# Patient Record
Sex: Male | Born: 1935 | Race: Black or African American | Hispanic: No | Smoking: Never smoker
Health system: Southern US, Community
[De-identification: ages and names within clinical notes are randomized; demographics above are authoritative.]

## PROBLEM LIST (undated history)

## (undated) DIAGNOSIS — R1310 Dysphagia, unspecified: Secondary | ICD-10-CM

## (undated) DIAGNOSIS — D649 Anemia, unspecified: Secondary | ICD-10-CM

## (undated) DIAGNOSIS — M1990 Unspecified osteoarthritis, unspecified site: Secondary | ICD-10-CM

## (undated) DIAGNOSIS — I5022 Chronic systolic (congestive) heart failure: Secondary | ICD-10-CM

## (undated) DIAGNOSIS — N289 Disorder of kidney and ureter, unspecified: Secondary | ICD-10-CM

## (undated) DIAGNOSIS — I10 Essential (primary) hypertension: Secondary | ICD-10-CM

## (undated) DIAGNOSIS — E119 Type 2 diabetes mellitus without complications: Secondary | ICD-10-CM

## (undated) DIAGNOSIS — N400 Enlarged prostate without lower urinary tract symptoms: Secondary | ICD-10-CM

## (undated) DIAGNOSIS — E269 Hyperaldosteronism, unspecified: Secondary | ICD-10-CM

---

## 2006-10-14 ENCOUNTER — Inpatient Hospital Stay (HOSPITAL_COMMUNITY): Admission: EM | Admit: 2006-10-14 | Discharge: 2006-10-18 | Payer: Self-pay | Admitting: Emergency Medicine

## 2006-10-14 ENCOUNTER — Encounter (INDEPENDENT_AMBULATORY_CARE_PROVIDER_SITE_OTHER): Payer: Self-pay | Admitting: Surgery

## 2006-10-14 ENCOUNTER — Ambulatory Visit: Payer: Self-pay | Admitting: Family Medicine

## 2006-10-14 LAB — CONVERTED CEMR LAB: PSA: NORMAL ng/mL

## 2006-11-01 ENCOUNTER — Encounter (INDEPENDENT_AMBULATORY_CARE_PROVIDER_SITE_OTHER): Payer: Self-pay | Admitting: *Deleted

## 2006-11-01 ENCOUNTER — Ambulatory Visit: Payer: Self-pay | Admitting: Family Medicine

## 2006-11-01 DIAGNOSIS — D649 Anemia, unspecified: Secondary | ICD-10-CM

## 2006-11-01 DIAGNOSIS — R809 Proteinuria, unspecified: Secondary | ICD-10-CM | POA: Insufficient documentation

## 2006-11-01 LAB — CONVERTED CEMR LAB: Urinalysis: ABNORMAL

## 2006-11-02 LAB — CONVERTED CEMR LAB
BUN: 10 mg/dL (ref 6–23)
CO2: 25 meq/L (ref 19–32)
Chloride: 101 meq/L (ref 96–112)
Direct LDL: 119 mg/dL — ABNORMAL HIGH
Glucose, Bld: 170 mg/dL — ABNORMAL HIGH (ref 70–99)
Hemoglobin: 12.6 g/dL — ABNORMAL LOW (ref 13.0–17.0)
Potassium: 4.5 meq/L (ref 3.5–5.3)
RBC: 4.31 M/uL (ref 4.22–5.81)
Sodium: 137 meq/L (ref 135–145)
WBC: 4.5 10*3/uL (ref 4.0–10.5)

## 2006-11-11 ENCOUNTER — Ambulatory Visit: Payer: Self-pay | Admitting: Family Medicine

## 2006-11-11 ENCOUNTER — Encounter (INDEPENDENT_AMBULATORY_CARE_PROVIDER_SITE_OTHER): Payer: Self-pay | Admitting: *Deleted

## 2006-11-11 LAB — CONVERTED CEMR LAB
BUN: 10 mg/dL (ref 6–23)
CO2: 23 meq/L (ref 19–32)
Chloride: 106 meq/L (ref 96–112)
Creatinine, Ser: 0.72 mg/dL (ref 0.40–1.50)
Glucose, Bld: 138 mg/dL — ABNORMAL HIGH (ref 70–99)
HDL: 55 mg/dL (ref >39)
LDL Cholesterol: 96 mg/dL (ref 0–99)

## 2006-11-18 ENCOUNTER — Telehealth (INDEPENDENT_AMBULATORY_CARE_PROVIDER_SITE_OTHER): Payer: Self-pay | Admitting: *Deleted

## 2006-11-29 ENCOUNTER — Ambulatory Visit: Payer: Self-pay | Admitting: Family Medicine

## 2006-11-29 DIAGNOSIS — E785 Hyperlipidemia, unspecified: Secondary | ICD-10-CM

## 2006-11-29 LAB — CONVERTED CEMR LAB: Hgb A1c MFr Bld: 8.9 %

## 2006-12-20 ENCOUNTER — Encounter (INDEPENDENT_AMBULATORY_CARE_PROVIDER_SITE_OTHER): Payer: Self-pay | Admitting: *Deleted

## 2006-12-24 ENCOUNTER — Encounter (INDEPENDENT_AMBULATORY_CARE_PROVIDER_SITE_OTHER): Payer: Self-pay | Admitting: *Deleted

## 2007-02-07 ENCOUNTER — Encounter (INDEPENDENT_AMBULATORY_CARE_PROVIDER_SITE_OTHER): Payer: Self-pay | Admitting: *Deleted

## 2007-09-10 ENCOUNTER — Encounter (INDEPENDENT_AMBULATORY_CARE_PROVIDER_SITE_OTHER): Payer: Self-pay | Admitting: *Deleted

## 2007-09-10 ENCOUNTER — Ambulatory Visit: Payer: Self-pay | Admitting: Family Medicine

## 2007-09-10 LAB — CONVERTED CEMR LAB
ALT: 8 units/L (ref 0–53)
AST: 9 units/L (ref 0–37)
Albumin: 4.6 g/dL (ref 3.5–5.2)
Alkaline Phosphatase: 99 units/L (ref 39–117)
BUN: 14 mg/dL (ref 6–23)
Hgb A1c MFr Bld: 9.8 %
Potassium: 4.4 meq/L (ref 3.5–5.3)

## 2007-09-11 ENCOUNTER — Encounter (INDEPENDENT_AMBULATORY_CARE_PROVIDER_SITE_OTHER): Payer: Self-pay | Admitting: *Deleted

## 2007-10-14 ENCOUNTER — Ambulatory Visit: Payer: Self-pay | Admitting: Family Medicine

## 2007-12-23 ENCOUNTER — Encounter (INDEPENDENT_AMBULATORY_CARE_PROVIDER_SITE_OTHER): Payer: Self-pay | Admitting: Family Medicine

## 2007-12-23 ENCOUNTER — Ambulatory Visit: Payer: Self-pay | Admitting: Sports Medicine

## 2007-12-23 DIAGNOSIS — F528 Other sexual dysfunction not due to a substance or known physiological condition: Secondary | ICD-10-CM

## 2007-12-23 LAB — CONVERTED CEMR LAB: Direct LDL: 74 mg/dL

## 2007-12-24 ENCOUNTER — Encounter (INDEPENDENT_AMBULATORY_CARE_PROVIDER_SITE_OTHER): Payer: Self-pay | Admitting: Family Medicine

## 2008-02-10 ENCOUNTER — Telehealth: Payer: Self-pay | Admitting: *Deleted

## 2008-03-29 ENCOUNTER — Telehealth: Payer: Self-pay | Admitting: *Deleted

## 2008-04-19 ENCOUNTER — Telehealth: Payer: Self-pay | Admitting: Family Medicine

## 2008-06-18 ENCOUNTER — Ambulatory Visit: Payer: Self-pay | Admitting: Family Medicine

## 2008-06-18 ENCOUNTER — Telehealth: Payer: Self-pay | Admitting: Family Medicine

## 2008-06-22 ENCOUNTER — Telehealth: Payer: Self-pay | Admitting: *Deleted

## 2008-06-24 ENCOUNTER — Telehealth: Payer: Self-pay | Admitting: *Deleted

## 2008-07-29 ENCOUNTER — Encounter: Payer: Self-pay | Admitting: Family Medicine

## 2008-11-18 IMAGING — CT CT PELVIS W/ CM
2 of 5 series · 17 of 46 positions shown, 19 images · IV contrast (APPLIED)
Comparison: none

10/16/07 ? DUPLICATE COPY for exam association in [facility] ? No change from original report.
CLINICAL DATA: Abdominal pain.
 ABDOMEN CT WITH CONTRAST:
TECHNIQUE: Multidetector CT imaging of the abdomen was performed following the standard protocol during bolus administration of intravenous contrast.
 Contrast:  70 cc Omnipaque 300 and oral contrast.
TECHNIQUE: Multidetector CT imaging of the pelvis was performed following the standard protocol during bolus administration of intravenous contrast.

[Series 2: abd/pelv with 5.0 b31f st · axial · 0.62mm/px · z∈[+534,+940]mm · 14 of 91 slices shown, 16 images]
[im 5/91  soft-tissue]
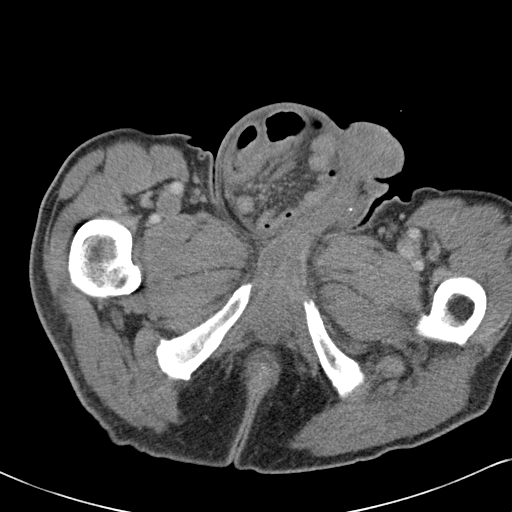
[im 5/91  bone]
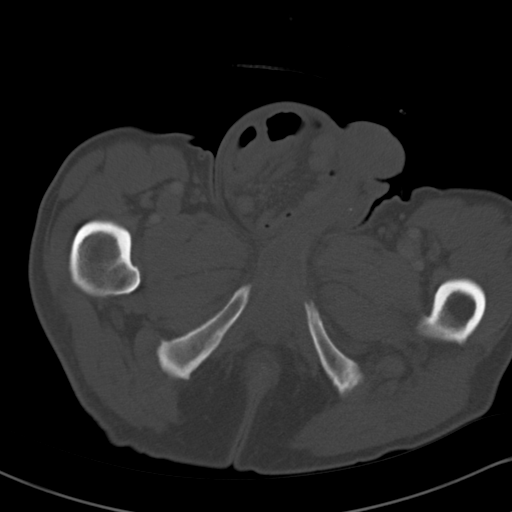
[im 14/91  soft-tissue]
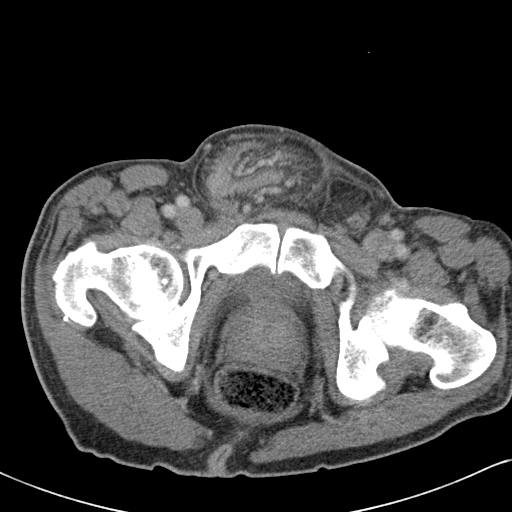
[im 19/91  soft-tissue]
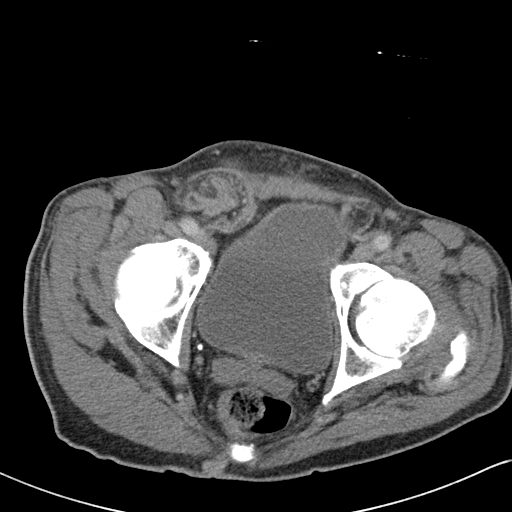
[im 23/91  soft-tissue]
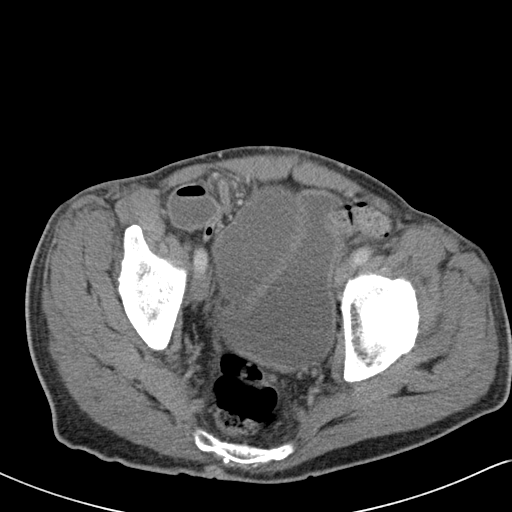
[im 32/91  soft-tissue]
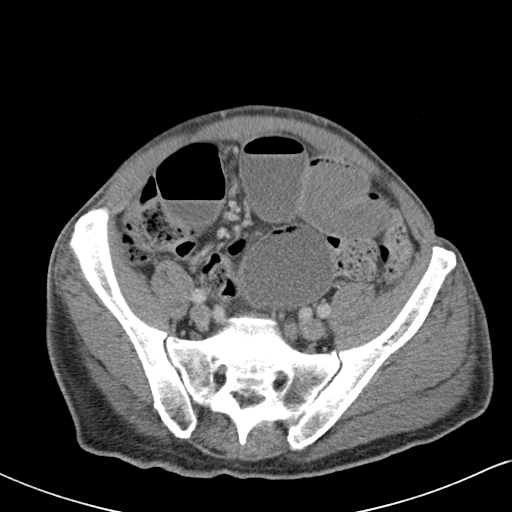
[im 37/91  soft-tissue]
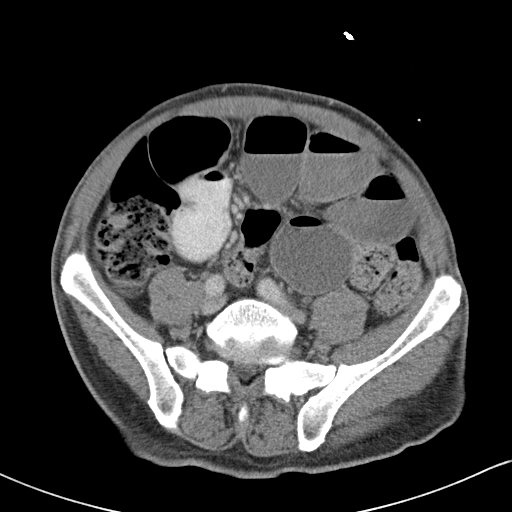
[im 41/91  soft-tissue]
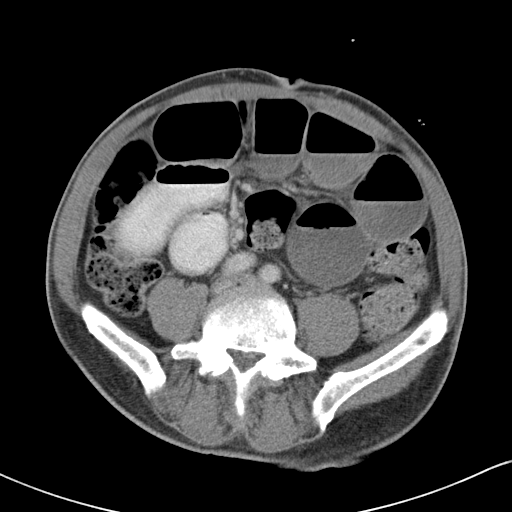
[im 50/91  soft-tissue]
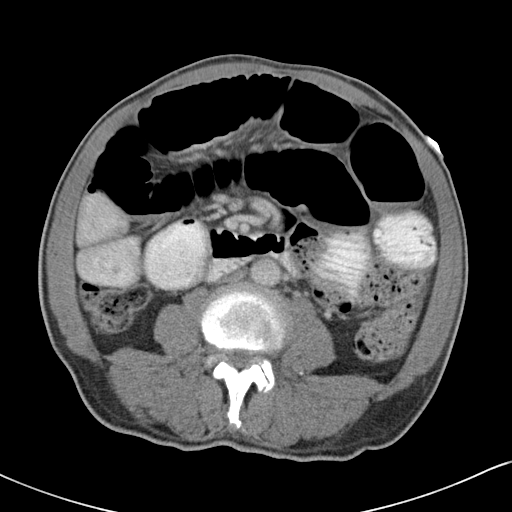
[im 55/91  soft-tissue]
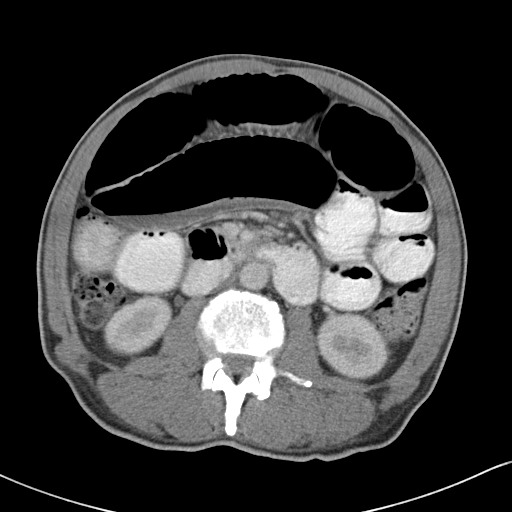
[im 55/91  bone]
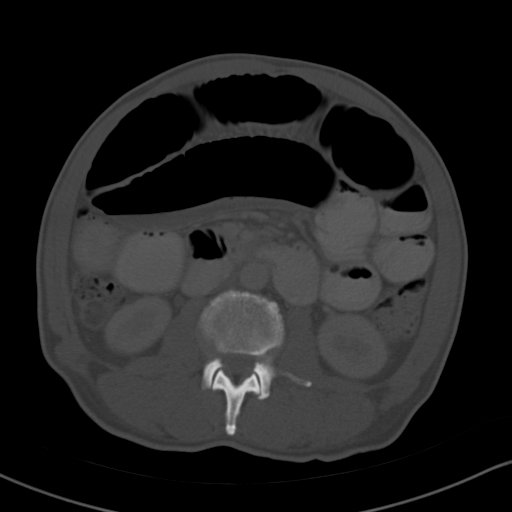
[im 59/91  soft-tissue]
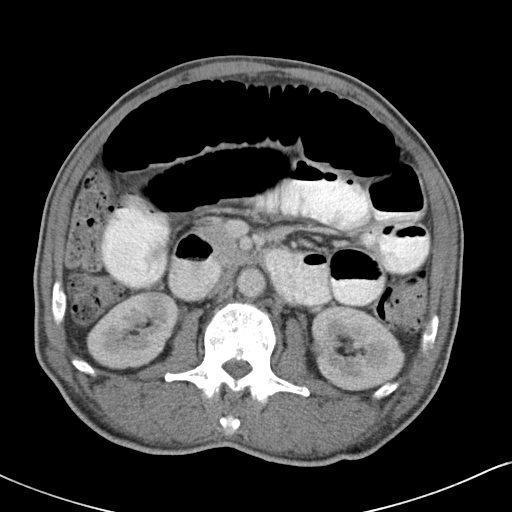
[im 68/91  soft-tissue]
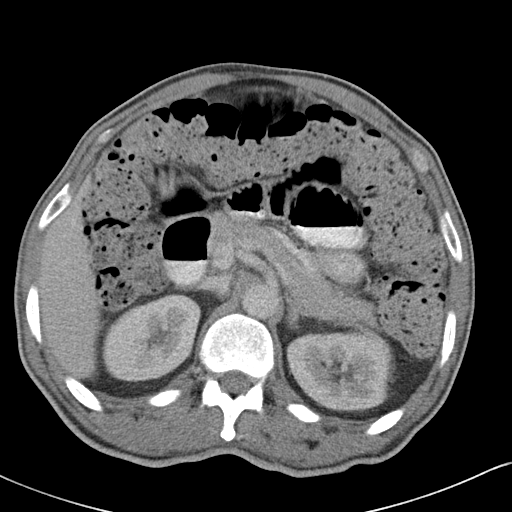
[im 73/91  soft-tissue]
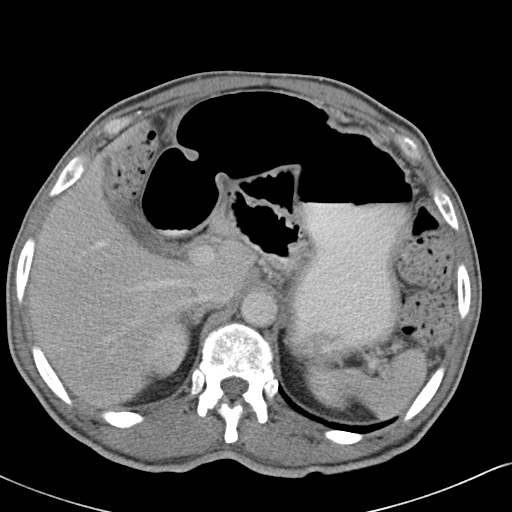
[im 77/91  soft-tissue]
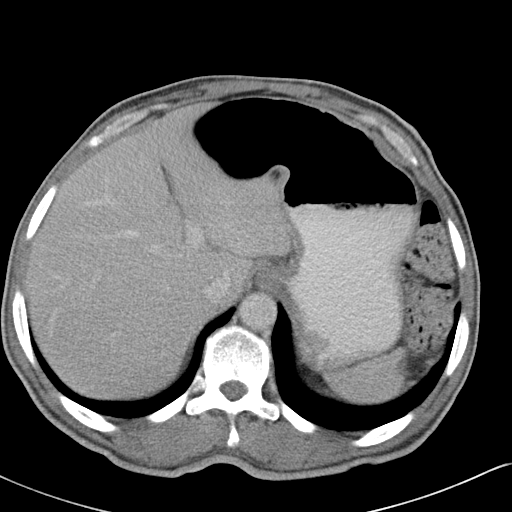
[im 86/91  soft-tissue]
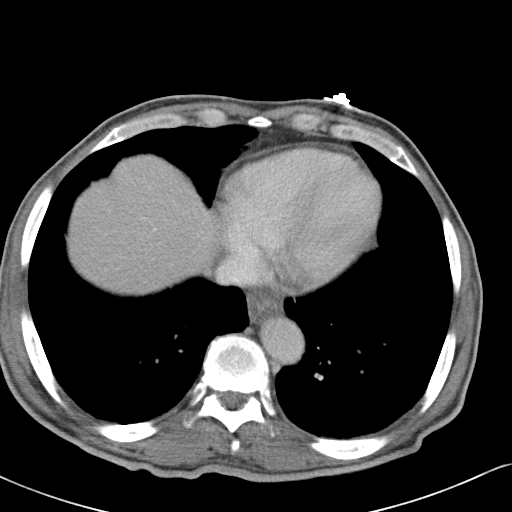

[Series 602: coronal a/p · coronal · 0.89mm/px · 3 of 122 slices shown]
[im 41/122  soft-tissue]
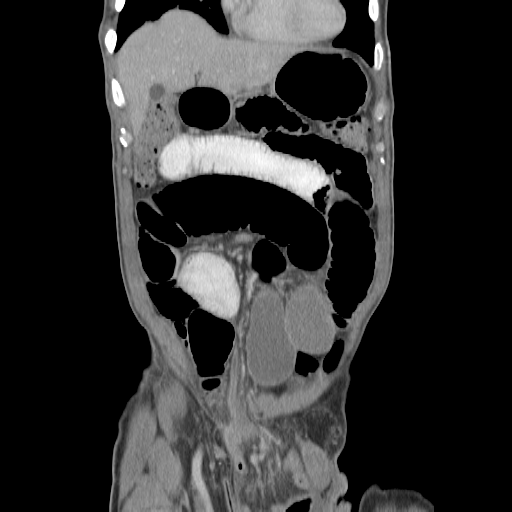
[im 54/122  soft-tissue]
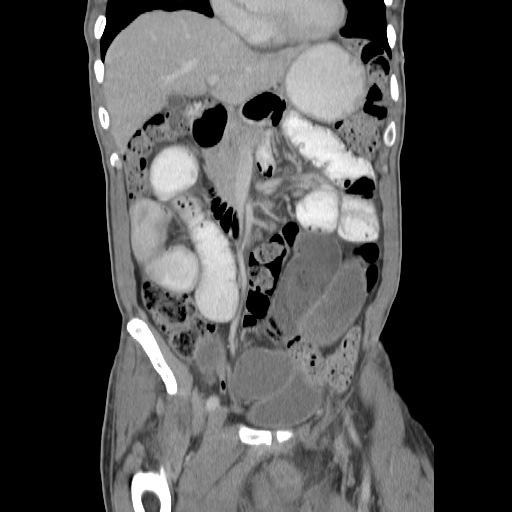
[im 68/122  soft-tissue]
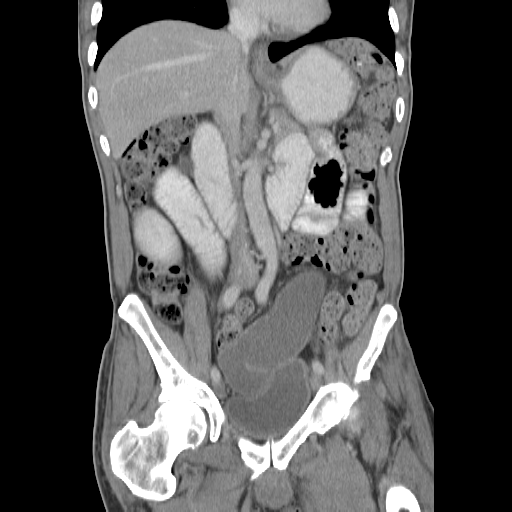

[17 of 46 positions shown; findings below may reference images not displayed]

FINDINGS: Distended loops of small bowel consistent with small bowel obstruction are noted.  There is diffuse inspissated fecal material throughout the colon, likely reflecting an element of colonic impaction.  The patient is noted to have a large right inguinal hernia.  Lung bases are clear.  There is no pericardial or pleural effusions.  Liver and spleen are normal.  The pancreas and adrenal glands are normal.  Kidneys show symmetric uptake and excretion of contrast. Simple cyst is seen within the right kidney.  There is no mesenteric or retroperitoneal adenopathy.
IMPRESSION: Small bowel obstruction, likely secondary to right inguinal hernia.  Exam is otherwise unremarkable. 
 PELVIS CT WITH CONTRAST:
FINDINGS: Right inguinal hernia defect is identified.  Loops of large and small bowel extend into the defect.  Mesenteric inflammatory changes are appreciated.  No free fluid.
IMPRESSION: Incarcerated right inguinal hernia.  
 The findings were phoned to Dr. Enei on the [REDACTED] Service.

## 2009-01-29 ENCOUNTER — Encounter (INDEPENDENT_AMBULATORY_CARE_PROVIDER_SITE_OTHER): Payer: Self-pay | Admitting: *Deleted

## 2009-01-29 DIAGNOSIS — F17200 Nicotine dependence, unspecified, uncomplicated: Secondary | ICD-10-CM | POA: Insufficient documentation

## 2009-05-17 ENCOUNTER — Encounter: Payer: Self-pay | Admitting: Family Medicine

## 2009-05-17 ENCOUNTER — Ambulatory Visit: Payer: Self-pay | Admitting: Family Medicine

## 2009-05-17 DIAGNOSIS — E1165 Type 2 diabetes mellitus with hyperglycemia: Secondary | ICD-10-CM

## 2009-05-17 DIAGNOSIS — I10 Essential (primary) hypertension: Secondary | ICD-10-CM

## 2009-05-17 LAB — CONVERTED CEMR LAB
ALT: 11 units/L (ref 0–53)
AST: 14 units/L (ref 0–37)
Albumin: 4.5 g/dL (ref 3.5–5.2)
BUN: 19 mg/dL (ref 6–23)
Calcium: 10 mg/dL (ref 8.4–10.5)
Chloride: 93 meq/L — ABNORMAL LOW (ref 96–112)
Hgb A1c MFr Bld: >14 %
Potassium: 4.5 meq/L (ref 3.5–5.3)

## 2009-05-18 ENCOUNTER — Encounter: Payer: Self-pay | Admitting: *Deleted

## 2009-05-25 ENCOUNTER — Telehealth: Payer: Self-pay | Admitting: Family Medicine

## 2009-06-01 ENCOUNTER — Telehealth: Payer: Self-pay | Admitting: Family Medicine

## 2010-05-24 NOTE — Assessment & Plan Note (Signed)
Summary: f/up diabetes,tcb   Vital Signs:  Patient profile:   77 year old male Height:      66 inches Weight:      147.7 pounds BMI:     23.93 Temp:     97.6 degrees F Pulse rate:   96 / minute BP sitting:   147 / 79  Primary Care Provider:  Lequita Asal  MD  CC:  f/u DM, HTN, and HLD.  History of Present Illness: 77 y/o male here for f/u. has not been seen since February 2010.   DM- out of medications for "a couple months." patient doesn't check cbgs at home. last A1C Sept 2009 was 8. patient endorses polyuria, denies polydipsia, weight loss. was supposed to be on glipizide, metformin and januvia.  BP- denies chest pain, SOB, peripheral edema, headaches, visual changes. was on enalapril.   HLD- was on pravastatin. last LDL <100.   Habits & Providers  Alcohol-Tobacco-Diet     Tobacco Status: current     Tobacco Counseling: to quit use of tobacco products     Other Tobacco pipe  Current Medications (verified): 1)  Aspir-Low 81 Mg Tbec (Aspirin) .Marland Kitchen.. 1 By Mouth Qday  Allergies (verified): No Known Drug Allergies  Past History:  Past Medical History: Diabetes mellitus, type II Hyperlipidemia Hypertension  Social History: Marital Status: single with several girlfriends.  Children: 3 children, only 1 lives in area. Occupation: worked at [employer] and is semi-retired, laid off early 2009, still looking for work Former Smoker (1/4 ppd x 50 yrs, quit 10 yrs ago). currently smokes pipe daily. Alcohol use-occasional. reports small amt of brandy in morning and evening.  Drug use-no  Physical Exam  General:  thin male, NAD. vitals reviewed.  Eyes:  EOMI, PERRLA Mouth:  MMM Neck:  No deformities, masses, or tenderness noted. Lungs:  prolonged expiratory phase. normal WOB. no accessory muscle use.  Heart:  RRR; no m/r/g Abdomen:  thin NT, ND, +BS Pulses:  R and L carotid,radial,dorsalis pedis and posterior tibial pulses are full and equal  bilaterally Extremities:  no peripheral edema of BLE Neurologic:  alert & oriented X3.     Impression & Recommendations:  Problem # 1:  DIABETES MELLITUS, TYPE II (ICD-250.00) Assessment Deteriorated A1C >14 since off meds. patient opposed to insulin at this time. explained that he will likely need insulin to get to goal. explained what A1C is. will refill oral hypoglycemics. will check CMP to eval renal function prior to refill of metformin. will refer for dilated eye exam. patient declines pneumovax.   The following medications were removed from the medication list:    Enalapril Maleate 5 Mg Tabs (Enalapril maleate) .Marland Kitchen... 1 by mouth qday His updated medication list for this problem includes:    Glucotrol 10 Mg Tabs (Glipizide) .Marland Kitchen... 1 by mouth two times a day for diabetes    Aspir-low 81 Mg Tbec (Aspirin) .Marland Kitchen... 1 by mouth qday    Metformin Hcl 1000 Mg Tabs (Metformin hcl) .Marland Kitchen... Take 1 tablet by mouth two times a day for diabetes    Januvia 100 Mg Tabs (Sitagliptin phosphate) .Marland Kitchen... 1 by mouth daily for diabetes    Lisinopril 20 Mg Tabs (Lisinopril) ..... One tab by mouth at bedtime for blood pressure  Orders: A1C-[facility] ([address]) Comp Met-[facility] ([address])  Problem # 2:  HYPERTENSION, BENIGN ESSENTIAL (ICD-401.1) Assessment: Deteriorated  refill ACE-I. will change to lisinopril.   The following medications were removed from the medication list:    Enalapril  Maleate 5 Mg Tabs (Enalapril maleate) .Marland Kitchen... 1 by mouth qday His updated medication list for this problem includes:    Lisinopril 20 Mg Tabs (Lisinopril) ..... One tab by mouth at bedtime for blood pressure  Orders: [facility]- Est  Level 4 ([address])  Problem # 3:  HYPERLIPIDEMIA (ICD-272.4) Assessment: Unchanged last LDL at goal. check direct LDL today.   His updated medication list for this problem includes:    Pravastatin Sodium 20 Mg Tabs (Pravastatin sodium) ..... One by mouth at bedtime for cholesterol  Orders: Direct LDL-[facility]  ([address]) [facility]- Est  Level 4 ([address])  Problem # 4:  TOBACCO USER (ICD-305.1) Assessment: Unchanged encouraged to quit  Problem # 5:  Preventive Health Care (ICD-V70.0) patient declines vaccines. due for PSA. will add to labs. up to date on colorectal cancer screening. will refer for dilated eye exam.   Other Orders: PSA-[facility] ([address]) Ophthalmology Referral (Ophthalmology)  Patient Instructions: 1)  Nice to have met you! 2)  I will send refills of all your medications.  3)  Follow up with Dr. Lanier Prude in 3 months about your diabetes.  Prescriptions: JANUVIA 100 MG TABS (SITAGLIPTIN PHOSPHATE) 1 by mouth daily FOR DIABETES  #30 x 4   Entered and Authorized by:   Lequita Asal  MD   Signed by:   Lequita Asal  MD on 05/17/2009   Method used:   Electronically to        [facility] [phone]* (retail)       [address]       [place], [place]  [address]       Ph: [phone] or [phone]       Fax: [phone]   RxID:   [id number] METFORMIN HCL 1000 MG TABS (METFORMIN HCL) Take 1 tablet by mouth two times a day FOR DIABETES  #60 x 4   Entered and Authorized by:   Lequita Asal  MD   Signed by:   Lequita Asal  MD on 05/17/2009   Method used:   Electronically to        [facility] [phone]* (retail)       [address]       [place], [place]  [address]       Ph: [phone] or [phone]       Fax: [phone]   RxID:   [id number] PRAVASTATIN SODIUM 20 MG  TABS (PRAVASTATIN SODIUM) one by mouth at bedtime FOR CHOLESTEROL  #30 x 4   Entered and Authorized by:   Lequita Asal  MD   Signed by:   Lequita Asal  MD on 05/17/2009   Method used:   Electronically to        [facility]. [phone]* (retail)       [address]       [place], [place]  [address]       Ph: [phone] or [phone]       Fax: [phone]   RxID:    [id number] GLUCOTROL 10 MG  TABS (GLIPIZIDE) 1 by mouth two times a day for DIABETES  #60 x 4   Entered and Authorized by:   Lequita Asal  MD   Signed by:   Lequita Asal  MD on 05/17/2009   Method used:   [facility]  [address] [phone]* (retail)       [address]       [place], [place]  [address]       Ph: [phone] or [phone]       Fax: [phone]   RxID:   [id number] LISINOPRIL 20 MG TABS (LISINOPRIL) one tab by mouth at bedtime for BLOOD PRESSURE  #30 x 4   Entered and Authorized by:   Lequita Asal  MD   Signed by:   Lequita Asal  MD on 05/17/2009   Method used:   Electronically to        [facility]. [phone]* (retail)       [address]       [place], [place]  [address]       Ph: [phone] or [phone]       Fax: [phone]   RxID:   [id number]   Laboratory Results   Blood Tests   Date/Time Received: May 17, 2009 10:05 AM  Date/Time Reported: May 17, 2009 10:34 AM    HGBA1C: >14.0%   (Normal Range: Non-Diabetic - 3-6%   Control Diabetic - 6-8%)  Comments: ...........test performed by............Marland KitchenBAJordan, MT(ASCP)10:34 AM      Prevention & Chronic Care Immunizations   Influenza vaccine: Not documented   Influenza vaccine deferral: Refused  (05/17/2009)    Tetanus booster: 09/10/2007: Tdap   Tetanus booster due: 09/09/2017    Pneumococcal vaccine: Not documented   Pneumococcal vaccine deferral: Refused  (05/17/2009)    H. zoster vaccine: Not documented   H. zoster vaccine deferral: Refused  (05/17/2009)  Colorectal Screening   Hemoccult: negative  (11/29/2006)   Hemoccult action/deferral: Not indicated  (05/17/2009)   Hemoccult due: 11/2007    Colonoscopy: normal  (02/07/2007)   Colonoscopy action/deferral: Repeat colonoscopy in 5 years.   (02/07/2007)   Colonoscopy due: 02/2012  Other Screening   PSA:  normal  (10/14/2006)   PSA ordered.   PSA due due: 10/2007   Smoking status: current  (05/17/2009)   Smoking cessation counseling: yes  (06/18/2008)  Diabetes Mellitus   HgbA1C: >14.0  (05/17/2009)   Hemoglobin A1C due: 12/11/2007    Eye exam: glaucoma, no retinopathy  (08/05/2007)   Eye exam due: 08/04/2008    Foot exam: yes  (09/10/2007)   High risk foot: Not documented   Foot care education: Not documented   Foot exam due: 09/09/2008    Urine microalbumin/creatinine ratio: Not documented   Urine microalbumin/cr due: 11/01/2007    Diabetes flowsheet reviewed?: Yes   Progress toward A1C goal: Deteriorated  Lipids   Total Cholesterol: 162  (11/11/2006)   LDL: 96  (11/11/2006)   LDL Direct: 74  (12/23/2007)   HDL: 55  (11/11/2006)   Triglycerides: 53  (11/11/2006)    SGOT (AST): 9  (09/10/2007)   SGPT (ALT): 8  (09/10/2007) CMP ordered    Alkaline phosphatase: 99  (09/10/2007)   Total bilirubin: 0.6  (09/10/2007)    Lipid flowsheet reviewed?: Yes   Progress toward LDL goal: At goal  Hypertension   Last Blood Pressure: 147 / 79  (05/17/2009)   Serum creatinine: 0.91  (09/10/2007)   Serum potassium 4.4  (09/10/2007) CMP ordered     Hypertension flowsheet reviewed?: Yes   Progress toward BP goal: At goal  Self-Management Support :   Personal Goals (by the next clinic visit) :  Personal A1C goal: 7  (05/17/2009)     Personal blood pressure goal: 130/80  (05/17/2009)     Personal LDL goal: 100  (05/17/2009)    Patient will work on the following items until the next clinic visit to reach self-care goals:     Medications and monitoring: take my medicines every day, check my blood sugar  (05/17/2009)     Eating: eat baked foods instead of fried foods, limit or avoid alcohol  (05/17/2009)     Activity: take a 30 minute walk every day  (05/17/2009)    Diabetes self-management support: CBG self-monitoring log, Education handout, Written self-care plan   (05/17/2009)   Diabetes care plan printed   Diabetes education handout printed    Hypertension self-management support: Written self-care plan  (05/17/2009)   Hypertension self-care plan printed.    Lipid self-management support: Written self-care plan  (05/17/2009)   Lipid self-care plan printed.    Lipid self-management support not done because: Good outcomes  (05/17/2009)

## 2010-05-24 NOTE — Letter (Signed)
Summary: Generic Letter  [facility]  [address]   [place], [place] [address]   Phone: [phone]  Fax: [phone]    05/18/2009  [facility] [address] [place]  [address]  Dear Mr. Cina,  I made an appt for you with [facility] for Tuesday Febuary 1, 2011 at 10:00am.  There office is located at [address] [place], [place] [address] Ph. [phone].  Should you need to reschedule this appointment please give thier office a 24 hour notice of cancellation.  Also would you please call our office and update your current telephone number.  Thank you in advance for your cooperation.        Sincerely,   Loralee Pacas CMA

## 2010-05-24 NOTE — Progress Notes (Signed)
Summary: phn msg  Phone Note Call from Patient Call back at Home Phone [phone]   Caller: Patient Summary of Call: pt called to say he has implants in his eye and did not need to go to Dr Emily Filbert - he sees Dr Vonna Kotyk. Initial call taken by: De Nurse,  June 01, 2009 9:06 AM  Follow-up for Phone Call        To PCP for FYI Follow-up by: Gladstone Pih,  June 01, 2009 10:11 AM  Additional Follow-up for Phone Call Additional follow up Details #1::        okay.  Additional Follow-up by: Lequita Asal  MD,  June 01, 2009 1:40 PM

## 2010-05-24 NOTE — Progress Notes (Signed)
Summary: phn msg  Phone Note From Other Clinic   Caller: Dr. Emily Filbert Office -Eye Doctor Summary of Call: Pt did not show for appointment at his office today. Initial call taken by: Clydell Hakim,  May 25, 2009 3:27 PM  Follow-up for Phone Call        to PCP Follow-up by: Gladstone Pih,  May 25, 2009 3:50 PM

## 2010-09-05 NOTE — H&P (Signed)
NAME:  Duane Turner, Duane Turner NO.:  [id number]   MEDICAL RECORD NO.:  [id number]          PATIENT TYPE:  INP   LOCATION:  5727                         FACILITY:  [facility]   PHYSICIAN:  [name], M.D.   DATE OF BIRTH:  Aug 17, 1932   DATE OF ADMISSION:  10/13/2006  DATE OF DISCHARGE:                              HISTORY & PHYSICAL   CHIEF COMPLAINT:  Vomiting.   HISTORY OF PRESENT ILLNESS:  This is a 77 year old man who complains of  vomiting x3 or 4 days.  He has been unable to keep food down and does  not know if he can keep liquids down.  He denies blood in his vomitus.  He states he has not had a bowel movement in over 4 days.  His last  stool was soft and green.  He endorses an 80-pound weight loss over the  past year without really trying to lose weight.  He denies night sweats.  He denies tarry or black or red stools.  He complains of groin pain and  swelling for months.  He denies dysuria.  He has not seen a physician in  over 2 years.   PAST MEDICAL HISTORY:  Diabetes.   MEDICATIONS:  None.   ALLERGIES:  No known drug allergies.   FAMILY HISTORY:  Deferred.   SOCIAL HISTORY:  Denies tobacco, denies alcohol, denies drugs.   SURGICAL HISTORY:  None.   REVIEW OF SYSTEMS:  Please see HPI.  In addition, he denies bone pain,  denies dysuria, denies rashes, denies vision changes.   PHYSICAL EXAM:  VITAL SIGNS:  Blood pressure 120/72 with a low of 78/48  on admission.  Heart rate 116, previously 140.  Respiratory rate 17,  previously 35.  Temperature not taken.  GENERAL:  Not in any acute distress.  Somewhat lethargic but responsive.  HEENT:  Pupils equal, round, and reactive to light and accommodation.  Extraocular muscles intact.  Throat is without erythema.  He has dry  mucous membranes.  His nares are patent.  There is a left posterior  auricular lymphadenopathy, mobile node, nonfirm.  NECK:  No thyromegaly, no lymphadenopathy.  CARDIOVASCULAR:   Regular rate and rhythm.  No rubs, gallops, or murmurs.  There is a questionable split heart sound.  PULMONARY:  Clear to auscultation bilaterally.  ABDOMEN:  Soft, hypoactive bowel sounds with some tinkles heard.  Palpable firm area in the center of the abdomen, hepatomegaly versus  hard bowel, nontender.  SKIN:  He has tenting present.  EXTREMITIES:  No edema.  2+ pulses and 5/5 strength.  NEURO EXAM:  Cranial nerves II-XII are intact.  He is oriented x3.  He  has cerebellar function intact; however, he has somewhat slow movement.  GU EXAM/PROSTATE EXAM:  Unable to assessthe superior pole; however, the  inferior pole is within normal limits.  His right testicle was enlarged  with a firm area superiorly and softer area inferiorly.  Nontender with  questionable bowel sounds in his right testicle; the testicle is  softball size.  His left side is within normal limits.  Testes:  Both  testes are present, and they are not at that area of enlargement.   LABORATORY:  Sodium 128, potassium 3.2, chloride 90, bicarb 31.4, BUN  35, creatinine 1.5, glucose 594.  Hemoglobin 17.7, hematocrit 52, total  protein 7.8, AST 11, ALT 13, albumin 3.9, lipase 25, alk phos 114, total  bili 1.3.  Point of care enzymes are negative.  His venous blood gas:  PH 7.528, CO2 of 37.7, bicarb 31.4.   ASSESSMENT AND PLAN:  This is a 77 year old male with small bowel  obstruction and increased blood sugars.  1. Vomiting:  KUB shows small bowel obstruction.  We will keep the      patient n.p.o. and place a nasogastric tube with intermittent      suction overnight.  We curbsided [facility] surgery who wants the CT of      the abdomen and pelvis prior to a formal consult.  The patient      cannot tolerate p.o. contrast now.  We will decompress him with a      nasogastric tube, hydrate with IV fluids and order CT for the first      thing this morning.  Differential diagnosis for small bowel      obstruction includes malignancy  especially with his weight loss,      severe gastroparesis.  CT will help decide.  IV Phenergan for      p.r.n. nausea.  Bolus IV fluids and maintenance.  2. Small bowel obstruction:  We will get a CT of the abdomen and      consult surgery as needed.  3. Groin mass:  Differential diagnosis includes hernia, malignancy,      fibrotic tissue, hydrocele.  We will get an ultrasound when stable.      CT of the pelvis may help characterize as well.  4. Abdominal mass:  Mass versus hepatomegaly.  CT to evaluate.  5. Diabetes:  Increased blood sugar.  We will start insulin drip after      the first normal saline bolus at 6 units and then transition him to      subcutaneous Lantus, once his CBGs are less than 300.  We are going      to keep the drip for an hour and then transition him to off with      every 4 hours CBGs and sliding scale insulin, every 2 hours BMET.  6. Hydration:  We are going to continue to hydrate the patient, as he      is severely dehydrated.  We will give IV fluids bolus now and then      repeat and then we will run his fluids at a high rate.  We will use      normal saline fluid boluses followed by half-normal saline with      KCl, maintenance IV fluids at 200 an hour and 30 mL of KCl.  7. Weight loss:  Given the presentation of small bowel obstruction, it      is concerning for malignancy.  We will get a CT of the abdomen and      pelvis to assess for masses.  We will check a CEA 125 and a PSA to      help determine whether malignancy or not.  8. Acute renal failure, likely prerenal secondary to dehydration.  We      will aggressively hydrate.  9. Metabolic alkalosis:  Likely secondary to vomiting, hypokalemic      alkalosis.  We  will hydrate aggressively and check every 2 hour      BMET.           ______________________________  [name], M.D.     JT/MEDQ  D:  10/15/2006  T:  10/15/2006  Job:  [id number]

## 2010-09-05 NOTE — Discharge Summary (Signed)
NAME:  Duane Turner, Duane Turner NO.:  [id number]   MEDICAL RECORD NO.:  [id number]          PATIENT TYPE:  INP   LOCATION:  5727                         FACILITY:  [facility]   PHYSICIAN:  Pearlean Brownie, M.D.DATE OF BIRTH:  09/13/1932   DATE OF ADMISSION:  10/14/2006  DATE OF DISCHARGE:  10/18/2006                               DISCHARGE SUMMARY   DISCHARGE DIAGNOSES:  1. Small bowel obstruction secondary to hernia.  2. Diabetes mellitus.  3. An 80-pound weight loss.  4. Dehydration.  5. Acute renal failure.   MEDICATIONS ON DISCHARGE:  1. Glucotrol 10 mg twice daily with meals.  2. Percocet 5/325 1-2 tablets every 4-6 hours as needed for pain.   CONSULTATIONS:  [facility].   PROCEDURES PERFORMED:  1. CT scan of the abdomen and pelvis showed small bowel obstruction,      likely secondary to right inguinal hernia and incarcerated right      inguinal hernia.  2. Excision of a giant scrotal sac, drainage of right scrotal      hydrocele, repair of hernia with mesh.   LABORATORY INVESTIGATIONS:  Discharge laboratories included a BMP with a  sodium of 134, potassium of 4.0, chloride of 106, bicarbonate of 22,  glucose of 184, BUN of 6, creatinine of 0.62, calcium of 8.0.  White  blood count of 5.6, hemoglobin of 11.2, hematocrit of 32.9, platelets of  268,000.  A CEA was 1.4.  A C-peptide was 0.52.  TSH was 0.439.  An HIV  was nonreactive.  A CA-125 was 26.4.  PSA was 1.49.  Hemoglobin A1c was  13.6.   HOSPITAL COURSE:  [name] is a 77 year old man who presented to the  emergency department for vomiting for 3-4 days.  He has been unable to  keep any food down and it was unclear as to whether he could keep  liquids down.  The patient had not had a bowel movement in over four  days.  He also did describe an 80-pound weight loss over the last year  without trying.  He also complains of groin pain and swelling for  months.  He had not seen a physician in  over two years.  His hospital  course by problem is as follows:   1. Small bowel obstruction:  The patient's vomiting was secondary to a      small bowel obstruction seen on CT scan which in turn was secondary      to a large right inguinal hernia, with incarcerated but not      strangulated bowel also seen on CT scan.  The patient was admitted      and an NG tube was placed.  The patient was hydrated with IV      fluids.  The patient was kept NPO and on the morning after      admission the patient was taken to the operating room for resection      of the incarcerated right inguinal hernia and hernia repair with      mesh.  The patient tolerated the procedure well and  had an      unremarkable postoperative course.  The patient was discharged to      home with a prescription for Percocet for pain and is to see Dr.      Daphine Deutscher with [facility] two weeks after discharge.  2. Diabetes mellitus:  The patient has type 2 diabetes mellitus that      is very poorly controlled with a hemoglobin A1c of 13.6.  He has      not seen a physician in over two years and is not doing anything to      manage his diabetes.  He was resistant to the idea of insulin      shots.  Therefore, we started him on Glucotrol 10 mg twice daily      with meals.  This will likely need to be titrated up as an      outpatient.  However, prior to discharge, his sugars were very well-      controlled in the mid 100s on this medication.  3. An 80-pound weight loss:  The patient has had an 80-pound weight      loss without trying to lose weight over the past years.  There was      concern for abdominal malignancy given this extreme weight loss.      However, a CEA, CA-125 and PSA were all negative.  The patient had      heme negative stools prior to surgery and the weight loss is most      likely secondary to a longstanding history of blood sugars to 400-      500 range.  The patient needs to follow up with a  primary care      physician to evaluate further weight loss and to have a formal GI      work-up.  4. Acute renal failure:  The patient was admitted with a creatinine of      1.5 and was aggressively rehydrated as he was severely dehydrated.      This was felt most likely to be secondary to dehydration.  At the      time of discharge, the patient's creatinine had returned to normal      at 0.62.   FOLLOW-UP:  The patient is to follow up with Dr. Turner Daniels at [facility] following discharge; and the patient is to follow  up with Dr. Daphine Deutscher with [facility] two weeks after  discharge.      Levander Campion, M.D.  Electronically Signed      Pearlean Brownie, M.D.  Electronically Signed    JH/MEDQ  D:  11/05/2006  T:  11/06/2006  Job:  [id number]

## 2010-09-05 NOTE — Consult Note (Signed)
NAME:  Duane, PASION NO.:  [id number]   MEDICAL RECORD NO.:  [id number]          PATIENT TYPE:  INP   LOCATION:  1830                         FACILITY:  [facility]   PHYSICIAN:  Thornton Park. Daphine Deutscher, MD  DATE OF BIRTH:  12-30-1932   DATE OF CONSULTATION:  DATE OF DISCHARGE:                                 CONSULTATION   REASON FOR CONSULTATION:  Large right inguinal hernia with associated  small bowel obstruction.   HISTORY OF PRESENT ILLNESS:  Duane Turner  is a 77 year old male patient,  past medical history diabetes, currently not taking any medications.  Apparently has no primary physician.  He presented to the ER on June 22  complaining of nausea and vomiting and some abdominal pain, was found to  be hypotensive probably related to dehydration, had significant  electrolyte abnormalities.  A CT of the abdomen and pelvis was done with  contrast that demonstrated small bowel obstruction. He was also noted on  clinical exam to have a very large right inguinal hernia extending into  the scrotum area and on CT it looked like there was bowel in the actual  hernia area but difficult to tell whether this was actually incarcerated  or not.  The patient reports he had been having abdominal pain and  cramping for about 3-4 days.  Again, this patient is a very poor  historian and is having difficulty recollecting actual symptoms.  He  states he really has not eaten or drunken anything for several days.  He  does not remember when his last bowel movement was, but he says he was  able to void.  The patient has been admitted by the teaching service.  Important that his glucose was over 500 upon admission and he was  requiring an insulin drip.  This is now off and his electrolyte  imbalances been corrected with IV repletion of sodium and potassium.   REVIEW OF SYSTEMS:  As above.  The patient reports that he has had this  right inguinal hernia for a long time and it has been  enlarged for a  long time.   PAST MEDICAL HISTORY:  1. Diabetes.  2. Noncompliance.   PAST SURGICAL HISTORY:  The patient denies prior past surgical  procedures.   SOCIAL HISTORY:  The patient denies alcohol, tobacco or illegal drugs.  He states that his sister and his girlfriend insist that he come to the  hospital.   ALLERGIES:  NKDA.   MEDICATIONS:  None.   PHYSICAL EXAM:  GENERAL:  Pleasant male patient who is a poor historian  who has been complaining of recent problems related to abdominal  bloating, pain and nausea and vomiting.  VITAL SIGNS:  Temperature is 97, BP 112/74, initial BP was 78/48, pulse  was 106.  Initial pulse was greater than 120, respirations 20.  NEURO:  The patient is awake and oriented x3, moving all extremities x4  without any obvious focal deficits.  HEENT:  Head normocephalic, sclera noninjected, nonicteric.  NECK is supple without appreciable adenopathy.  CHEST:  Bilateral lung sounds are clear to  auscultation, respiratory  effort is nonlabored.  He is sating about 94-98%.  CARDIAC:  S1-S2 without obvious rubs, murmurs or gallops.  No JVD.  Pulse is regular.  ABDOMEN:  Soft and distended, umbilicus is distended.  No bowel sounds are auscultated.  An NG tube is in place, with  suction  with bilious returns.  No scars are noted on the abdominal region.  The  patient does have a faint supraumbilical linea [place]. GENITOURINARY:  External genitalia are normal in appearance except for very large  enlargement of the right scrotal area with palpable bowel in this area.  Both testes are palpable.  Dr. Daphine Deutscher attempted to reduce the hernia but  was unable to keep the hernia reduced.  Area is nontender and soft,  otherwise.  EXTREMITIES:  Thin and symmetrical in appearance without obvious edema,  cyanosis or clubbing.   LABS:  Sodium was 128, potassium 3.2, creatinine 1.5 and glucose 594 on  arrival.  After hydration and treatment with insulin drip,  sodium 136,  potassium 3.6, CO2 29, glucose 442, BUN 34, Creatinine 1.45, hemoglobin  A1c is 13.6.  Lipase is normal.  Diagnostic CT of the abdomen and pelvis is as noted.   IMPRESSION:  1. Large right inguinal hernia with bowel uncertain if incarcerated.  2. Small-bowel obstruction  3. Uncontrolled diabetes.  4. Volume depletion.   PLAN:  1. Dr. Daphine Deutscher has opted to go ahead and proceed with operative      intervention on the hernia today.  He feels that despite CT      appearance that there may be a potential torsion or twisting of the      bowel contributing to a small bowel obstruction, and this could be      considered a surgical emergency.  Therefore the patient will be      taken to the OR as soon as possible. We will ask to have a right      inguinal hernia repair done on this patient and possible small      bowel resection if the bowel has been incarcerated for a long      enough period of time.  2. Agree with IV fluid hydration using D5 normal saline with 30      potassium since the patient has been receiving insulin drip and      other doses of IV insulin.  We will go ahead and give him empiric      Unasyn IV and continue his NG tube for now.  We will give him      morphine and Zofran to treat other symptoms related to the small      bowel obstruction.  3. Risks and benefits of the procedure have been discussed with the      patient per Dr. Daphine Deutscher and he verbalizes understanding.      Allison L. Gwyneth Sprout Daphine Deutscher, MD  Electronically Signed    ALE/MEDQ  D:  10/14/2006  T:  10/14/2006  Job:  [id number]

## 2010-09-05 NOTE — Op Note (Signed)
NAME:  Duane Turner, Duane Turner NO.:  [id number]   MEDICAL RECORD NO.:  [id number]          PATIENT TYPE:  INP   LOCATION:  1830                         FACILITY:  [facility]   PHYSICIAN:  Thornton Park. Daphine Deutscher, MD  DATE OF BIRTH:  04-22-1933   DATE OF PROCEDURE:  10/14/2006  DATE OF DISCHARGE:                               OPERATIVE REPORT   PREOPERATIVE DIAGNOSIS:  Incarcerated right inguinal hernia.   POSTOPERATIVE DIAGNOSIS:  Giant scrotal hernia with incarceration status  post repair, resection and repair with mesh.   SURGEON:  Thornton Park. Daphine Deutscher, MD   ASSISTANT:  Dr. Altamese Cabal, family practice.   PROCEDURE:  Excision of giant scrotal sac, drainage of right scrotal  hydrocele, repair of hernia with mesh.   DESCRIPTION OF PROCEDURE:  Mr. Causer was taken to room 17 given  general anesthesia.  Abdomen was prepped with Techni-Care and draped  sterilely.  An oblique incision and as I approach this and massaged  this, I was able to get incarcerated hernia reduced.  Some difficulty  after isolating this cord structures, I mobilized sac which proved to be  very thick, chronic and gigantic.  In the process, I opened up the  hydrocele and drained this and found these little white intra hydrocele  bodies which I sent to pathology.  I stripped the cord structure away  from the hernia sac and I amputated it with a GIA 16 blue cartridge.  This returned into the abdomen after cauterized this controlling, had a  little oozing.  The repair, the floor was then repaired with a piece of  mesh sewn in place with 2-0 Prolene running suture on the inguinal  ligament above and an indirect internal muscle, external oblique was  closed over this with running 2-0 Vicryl.  4-0 Vicryl used in  subcutaneous tissue.  The patient tolerated procedure well and was taken  to recovery room in satisfactory condition.      Thornton Park Daphine Deutscher, MD  Electronically Signed     MBM/MEDQ  D:   10/14/2006  T:  10/15/2006  Job:  [id number]

## 2011-02-07 LAB — DIFFERENTIAL
Basophils Absolute: 0
Basophils Absolute: 0
Basophils Relative: 0
Eosinophils Relative: 2
Lymphocytes Relative: 15
Lymphs Abs: 0.8
Monocytes Relative: 5
Neutro Abs: 3.5
Neutro Abs: 6.8
Neutrophils Relative %: 84 — ABNORMAL HIGH

## 2011-02-07 LAB — BASIC METABOLIC PANEL
BUN: 27 — ABNORMAL HIGH
BUN: 32 — ABNORMAL HIGH
BUN: 6
CO2: 22
CO2: 25
CO2: 27
CO2: 29
CO2: 29
CO2: 31
CO2: 34 — ABNORMAL HIGH
Calcium: 8 — ABNORMAL LOW
Calcium: 8.2 — ABNORMAL LOW
Calcium: 8.8
Chloride: 100
Chloride: 103
Chloride: 106
Chloride: 93 — ABNORMAL LOW
Chloride: 99
Chloride: 99
Creatinine, Ser: 0.62
Creatinine, Ser: 0.67
Creatinine, Ser: 0.75
Creatinine, Ser: 1.04
Creatinine, Ser: 1.45
GFR calc Af Amer: 58 — ABNORMAL LOW
GFR calc Af Amer: >60
GFR calc Af Amer: >60
Glucose, Bld: 184 — ABNORMAL HIGH
Glucose, Bld: 207 — ABNORMAL HIGH
Glucose, Bld: 360 — ABNORMAL HIGH
Potassium: 3.8
Potassium: 3.9
Potassium: 4
Potassium: 4
Sodium: 135
Sodium: 135
Sodium: 140

## 2011-02-07 LAB — CBC
HCT: 32.9 — ABNORMAL LOW
HCT: 35.9 — ABNORMAL LOW
HCT: 36.9 — ABNORMAL LOW
Hemoglobin: 11.8 — ABNORMAL LOW
Hemoglobin: 12 — ABNORMAL LOW
MCHC: 32.8
MCHC: 33
MCHC: 33.8
MCHC: 34
MCV: 87.4
MCV: 88.3
MCV: 89.7
Platelets: 254
Platelets: 268
RBC: 4 — ABNORMAL LOW
RBC: 4.69
RDW: 13.2
WBC: 5
WBC: 5.4
WBC: 5.6

## 2011-02-07 LAB — URINALYSIS, ROUTINE W REFLEX MICROSCOPIC
Bilirubin Urine: NEGATIVE
Ketones, ur: 15 — AB
Nitrite: NEGATIVE
Protein, ur: NEGATIVE
Specific Gravity, Urine: 1.033 — ABNORMAL HIGH
Urobilinogen, UA: 0.2

## 2011-02-07 LAB — I-STAT 8, (EC8 V) (CONVERTED LAB)
Acid-Base Excess: 8 — ABNORMAL HIGH
TCO2: 32
pCO2, Ven: 37.7 — ABNORMAL LOW
pH, Ven: 7.528 — ABNORMAL HIGH

## 2011-02-07 LAB — CA 125: CA 125: 26.4

## 2011-02-07 LAB — HEPATIC FUNCTION PANEL
Alkaline Phosphatase: 114
Bilirubin, Direct: 0.2
Indirect Bilirubin: 1.1 — ABNORMAL HIGH
Total Bilirubin: 1.3 — ABNORMAL HIGH

## 2011-02-07 LAB — C-PEPTIDE: C-Peptide: 0.52 — ABNORMAL LOW

## 2011-02-07 LAB — POCT CARDIAC MARKERS
Myoglobin, poc: 121
Operator id: 277751
Troponin i, poc: <0.05

## 2011-02-07 LAB — HEMOGLOBIN A1C: Hgb A1c MFr Bld: 13.6 — ABNORMAL HIGH

## 2011-02-07 LAB — PSA: PSA: 1.49

## 2011-02-07 LAB — TSH: TSH: 0.439

## 2011-02-07 LAB — LIPASE, BLOOD: Lipase: 25

## 2015-09-28 DIAGNOSIS — I509 Heart failure, unspecified: Secondary | ICD-10-CM

## 2015-09-28 DIAGNOSIS — J189 Pneumonia, unspecified organism: Secondary | ICD-10-CM

## 2015-09-28 DIAGNOSIS — S0121XA Laceration without foreign body of nose, initial encounter: Secondary | ICD-10-CM

## 2015-09-28 DIAGNOSIS — W19XXXA Unspecified fall, initial encounter: Secondary | ICD-10-CM | POA: Diagnosis not present

## 2015-09-28 DIAGNOSIS — R601 Generalized edema: Secondary | ICD-10-CM | POA: Diagnosis not present

## 2015-09-28 DIAGNOSIS — S022XXA Fracture of nasal bones, initial encounter for closed fracture: Secondary | ICD-10-CM

## 2015-09-29 DIAGNOSIS — W19XXXA Unspecified fall, initial encounter: Secondary | ICD-10-CM | POA: Diagnosis not present

## 2015-09-29 DIAGNOSIS — S0121XA Laceration without foreign body of nose, initial encounter: Secondary | ICD-10-CM | POA: Diagnosis not present

## 2015-09-29 DIAGNOSIS — R601 Generalized edema: Secondary | ICD-10-CM | POA: Diagnosis not present

## 2015-09-29 DIAGNOSIS — I509 Heart failure, unspecified: Secondary | ICD-10-CM | POA: Diagnosis not present

## 2015-09-30 DIAGNOSIS — I509 Heart failure, unspecified: Secondary | ICD-10-CM | POA: Diagnosis not present

## 2015-09-30 DIAGNOSIS — S0121XA Laceration without foreign body of nose, initial encounter: Secondary | ICD-10-CM | POA: Diagnosis not present

## 2015-09-30 DIAGNOSIS — W19XXXA Unspecified fall, initial encounter: Secondary | ICD-10-CM | POA: Diagnosis not present

## 2015-09-30 DIAGNOSIS — R601 Generalized edema: Secondary | ICD-10-CM | POA: Diagnosis not present

## 2015-10-01 DIAGNOSIS — R601 Generalized edema: Secondary | ICD-10-CM | POA: Diagnosis not present

## 2015-10-01 DIAGNOSIS — W19XXXA Unspecified fall, initial encounter: Secondary | ICD-10-CM | POA: Diagnosis not present

## 2015-10-01 DIAGNOSIS — I509 Heart failure, unspecified: Secondary | ICD-10-CM | POA: Diagnosis not present

## 2015-10-01 DIAGNOSIS — S0121XA Laceration without foreign body of nose, initial encounter: Secondary | ICD-10-CM | POA: Diagnosis not present

## 2015-10-02 DIAGNOSIS — W19XXXA Unspecified fall, initial encounter: Secondary | ICD-10-CM | POA: Diagnosis not present

## 2015-10-02 DIAGNOSIS — R601 Generalized edema: Secondary | ICD-10-CM | POA: Diagnosis not present

## 2015-10-02 DIAGNOSIS — I509 Heart failure, unspecified: Secondary | ICD-10-CM | POA: Diagnosis not present

## 2015-10-02 DIAGNOSIS — S0121XA Laceration without foreign body of nose, initial encounter: Secondary | ICD-10-CM | POA: Diagnosis not present

## 2015-10-03 DIAGNOSIS — I509 Heart failure, unspecified: Secondary | ICD-10-CM | POA: Diagnosis not present

## 2015-10-03 DIAGNOSIS — R601 Generalized edema: Secondary | ICD-10-CM | POA: Diagnosis not present

## 2015-10-03 DIAGNOSIS — W19XXXA Unspecified fall, initial encounter: Secondary | ICD-10-CM | POA: Diagnosis not present

## 2015-10-03 DIAGNOSIS — S0121XA Laceration without foreign body of nose, initial encounter: Secondary | ICD-10-CM | POA: Diagnosis not present

## 2018-01-31 ENCOUNTER — Inpatient Hospital Stay
Admission: EM | Admit: 2018-01-31 | Discharge: 2018-02-02 | DRG: 641 | Disposition: A | Discharge status: Skilled Nursing Facility | Payer: Medicare Other | Source: Skilled Nursing Facility | Attending: Internal Medicine | Admitting: Internal Medicine

## 2018-01-31 ENCOUNTER — Emergency Department: Payer: Medicare Other

## 2018-01-31 ENCOUNTER — Encounter: Payer: Self-pay | Admitting: Emergency Medicine

## 2018-01-31 ENCOUNTER — Other Ambulatory Visit: Payer: Self-pay

## 2018-01-31 DIAGNOSIS — Z66 Do not resuscitate: Secondary | ICD-10-CM | POA: Diagnosis present

## 2018-01-31 DIAGNOSIS — N183 Chronic kidney disease, stage 3 (moderate): Secondary | ICD-10-CM | POA: Diagnosis present

## 2018-01-31 DIAGNOSIS — Z7984 Long term (current) use of oral hypoglycemic drugs: Secondary | ICD-10-CM | POA: Diagnosis not present

## 2018-01-31 DIAGNOSIS — M79605 Pain in left leg: Secondary | ICD-10-CM

## 2018-01-31 DIAGNOSIS — Z79899 Other long term (current) drug therapy: Secondary | ICD-10-CM

## 2018-01-31 DIAGNOSIS — N289 Disorder of kidney and ureter, unspecified: Secondary | ICD-10-CM

## 2018-01-31 DIAGNOSIS — M1990 Unspecified osteoarthritis, unspecified site: Secondary | ICD-10-CM | POA: Diagnosis present

## 2018-01-31 DIAGNOSIS — Z7401 Bed confinement status: Secondary | ICD-10-CM

## 2018-01-31 DIAGNOSIS — I130 Hypertensive heart and chronic kidney disease with heart failure and stage 1 through stage 4 chronic kidney disease, or unspecified chronic kidney disease: Secondary | ICD-10-CM | POA: Diagnosis present

## 2018-01-31 DIAGNOSIS — I5022 Chronic systolic (congestive) heart failure: Secondary | ICD-10-CM | POA: Diagnosis present

## 2018-01-31 DIAGNOSIS — I878 Other specified disorders of veins: Secondary | ICD-10-CM | POA: Diagnosis present

## 2018-01-31 DIAGNOSIS — Z993 Dependence on wheelchair: Secondary | ICD-10-CM

## 2018-01-31 DIAGNOSIS — E875 Hyperkalemia: Principal | ICD-10-CM | POA: Diagnosis present

## 2018-01-31 DIAGNOSIS — N400 Enlarged prostate without lower urinary tract symptoms: Secondary | ICD-10-CM | POA: Diagnosis present

## 2018-01-31 DIAGNOSIS — D631 Anemia in chronic kidney disease: Secondary | ICD-10-CM | POA: Diagnosis present

## 2018-01-31 DIAGNOSIS — I69354 Hemiplegia and hemiparesis following cerebral infarction affecting left non-dominant side: Secondary | ICD-10-CM

## 2018-01-31 DIAGNOSIS — R001 Bradycardia, unspecified: Secondary | ICD-10-CM | POA: Diagnosis not present

## 2018-01-31 DIAGNOSIS — I872 Venous insufficiency (chronic) (peripheral): Secondary | ICD-10-CM | POA: Diagnosis present

## 2018-01-31 DIAGNOSIS — I248 Other forms of acute ischemic heart disease: Secondary | ICD-10-CM | POA: Diagnosis present

## 2018-01-31 DIAGNOSIS — I959 Hypotension, unspecified: Secondary | ICD-10-CM | POA: Diagnosis present

## 2018-01-31 DIAGNOSIS — E871 Hypo-osmolality and hyponatremia: Secondary | ICD-10-CM | POA: Diagnosis present

## 2018-01-31 DIAGNOSIS — H9190 Unspecified hearing loss, unspecified ear: Secondary | ICD-10-CM | POA: Diagnosis present

## 2018-01-31 DIAGNOSIS — R011 Cardiac murmur, unspecified: Secondary | ICD-10-CM | POA: Diagnosis present

## 2018-01-31 DIAGNOSIS — E785 Hyperlipidemia, unspecified: Secondary | ICD-10-CM | POA: Diagnosis present

## 2018-01-31 DIAGNOSIS — N179 Acute kidney failure, unspecified: Secondary | ICD-10-CM | POA: Diagnosis present

## 2018-01-31 DIAGNOSIS — E1165 Type 2 diabetes mellitus with hyperglycemia: Secondary | ICD-10-CM | POA: Diagnosis present

## 2018-01-31 DIAGNOSIS — Z7982 Long term (current) use of aspirin: Secondary | ICD-10-CM | POA: Diagnosis not present

## 2018-01-31 DIAGNOSIS — R072 Precordial pain: Secondary | ICD-10-CM | POA: Diagnosis present

## 2018-01-31 DIAGNOSIS — R079 Chest pain, unspecified: Secondary | ICD-10-CM

## 2018-01-31 DIAGNOSIS — E1122 Type 2 diabetes mellitus with diabetic chronic kidney disease: Secondary | ICD-10-CM | POA: Diagnosis present

## 2018-01-31 DIAGNOSIS — M79604 Pain in right leg: Secondary | ICD-10-CM

## 2018-01-31 HISTORY — DX: Type 2 diabetes mellitus without complications: E11.9

## 2018-01-31 HISTORY — DX: Dysphagia, unspecified: R13.10

## 2018-01-31 HISTORY — DX: Hyperaldosteronism, unspecified: E26.9

## 2018-01-31 HISTORY — DX: Chronic systolic (congestive) heart failure: I50.22

## 2018-01-31 HISTORY — DX: Anemia, unspecified: D64.9

## 2018-01-31 HISTORY — DX: Disorder of kidney and ureter, unspecified: N28.9

## 2018-01-31 HISTORY — DX: Unspecified osteoarthritis, unspecified site: M19.90

## 2018-01-31 HISTORY — DX: Essential (primary) hypertension: I10

## 2018-01-31 HISTORY — DX: Benign prostatic hyperplasia without lower urinary tract symptoms: N40.0

## 2018-01-31 LAB — CBC
HEMATOCRIT: 30.6 % — AB (ref 39.0–52.0)
HEMOGLOBIN: 10.2 g/dL — AB (ref 13.0–17.0)
MCH: 30.4 pg (ref 26.0–34.0)
MCHC: 33.3 g/dL (ref 30.0–36.0)
MCV: 91.1 fL (ref 80.0–100.0)
NRBC: 0 % (ref 0.0–0.2)
PLATELETS: 137 10*3/uL — AB (ref 150–400)
RBC: 3.36 MIL/uL — AB (ref 4.22–5.81)
RDW: 15.2 % (ref 11.5–15.5)
WBC: 2.5 10*3/uL — ABNORMAL LOW (ref 4.0–10.5)

## 2018-01-31 LAB — BASIC METABOLIC PANEL
ANION GAP: 6 (ref 5–15)
BUN: 76 mg/dL — ABNORMAL HIGH (ref 8–23)
CHLORIDE: 102 mmol/L (ref 98–111)
CO2: 24 mmol/L (ref 22–32)
Calcium: 8.5 mg/dL — ABNORMAL LOW (ref 8.9–10.3)
Creatinine, Ser: 1.45 mg/dL — ABNORMAL HIGH (ref 0.61–1.24)
GFR calc non Af Amer: 43 mL/min — ABNORMAL LOW (ref >60)
GFR, EST AFRICAN AMERICAN: 50 mL/min — AB (ref >60)
Glucose, Bld: 243 mg/dL — ABNORMAL HIGH (ref 70–99)
POTASSIUM: 5.9 mmol/L — AB (ref 3.5–5.1)
Sodium: 132 mmol/L — ABNORMAL LOW (ref 135–145)

## 2018-01-31 LAB — GLUCOSE, CAPILLARY: Glucose-Capillary: 157 mg/dL — ABNORMAL HIGH (ref 70–99)

## 2018-01-31 LAB — TROPONIN I: Troponin I: 0.04 ng/mL (ref <0.03)

## 2018-01-31 MED ORDER — ACETAMINOPHEN 325 MG PO TABS
650.0000 mg | ORAL_TABLET | Freq: Four times a day (QID) | ORAL | Status: DC | PRN
  Administered 2018-01-31: 650 mg via ORAL
  Filled 2018-01-31: qty 2

## 2018-01-31 MED ORDER — ONDANSETRON HCL 4 MG/2ML IJ SOLN: 4.0000 mg | Freq: Four times a day (QID) | INTRAMUSCULAR | Status: DC | PRN

## 2018-01-31 MED ORDER — PRAVASTATIN SODIUM 40 MG PO TABS
40.0000 mg | ORAL_TABLET | Freq: Every day | ORAL | Status: DC
  Administered 2018-01-31 – 2018-02-01 (×2): 40 mg via ORAL
  Filled 2018-01-31 (×2): qty 1

## 2018-01-31 MED ORDER — HEPARIN SODIUM (PORCINE) 5000 UNIT/ML IJ SOLN
5000.0000 [IU] | Freq: Three times a day (TID) | INTRAMUSCULAR | Status: DC
  Administered 2018-01-31 – 2018-02-02 (×5): 5000 [IU] via SUBCUTANEOUS
  Filled 2018-01-31 (×5): qty 1

## 2018-01-31 MED ORDER — ASPIRIN EC 325 MG PO TBEC
325.0000 mg | DELAYED_RELEASE_TABLET | Freq: Every day | ORAL | Status: DC
  Administered 2018-02-01 – 2018-02-02 (×2): 325 mg via ORAL
  Filled 2018-01-31 (×2): qty 1

## 2018-01-31 MED ORDER — INSULIN ASPART 100 UNIT/ML ~~LOC~~ SOLN
0.0000 [IU] | Freq: Three times a day (TID) | SUBCUTANEOUS | Status: DC
  Administered 2018-02-01: 2 [IU] via SUBCUTANEOUS
  Administered 2018-02-02: 1 [IU] via SUBCUTANEOUS
  Filled 2018-01-31 (×2): qty 1

## 2018-01-31 MED ORDER — INSULIN ASPART 100 UNIT/ML IV SOLN
10.0000 [IU] | Freq: Once | INTRAVENOUS | Status: AC
  Administered 2018-01-31: 10 [IU] via INTRAVENOUS
  Filled 2018-01-31 (×2): qty 0.1

## 2018-01-31 MED ORDER — NITROGLYCERIN 0.4 MG SL SUBL: 0.4000 mg | SUBLINGUAL_TABLET | SUBLINGUAL | Status: DC | PRN

## 2018-01-31 MED ORDER — SENNOSIDES-DOCUSATE SODIUM 8.6-50 MG PO TABS: 1.0000 | ORAL_TABLET | Freq: Every evening | ORAL | Status: DC | PRN

## 2018-01-31 MED ORDER — ONDANSETRON HCL 4 MG PO TABS: 4.0000 mg | ORAL_TABLET | Freq: Four times a day (QID) | ORAL | Status: DC | PRN

## 2018-01-31 MED ORDER — SODIUM CHLORIDE 0.9 % IV SOLN
1.0000 g | Freq: Once | INTRAVENOUS | Status: AC
  Administered 2018-01-31: 1 g via INTRAVENOUS
  Filled 2018-01-31: qty 10

## 2018-01-31 MED ORDER — BISACODYL 5 MG PO TBEC: 5.0000 mg | DELAYED_RELEASE_TABLET | Freq: Every day | ORAL | Status: DC | PRN

## 2018-01-31 MED ORDER — HYDROCODONE-ACETAMINOPHEN 5-325 MG PO TABS
1.0000 | ORAL_TABLET | ORAL | Status: DC | PRN
  Administered 2018-02-01: 1 via ORAL
  Administered 2018-02-01 (×2): 2 via ORAL
  Filled 2018-01-31: qty 2
  Filled 2018-01-31: qty 1
  Filled 2018-01-31: qty 2

## 2018-01-31 MED ORDER — ALBUTEROL SULFATE (2.5 MG/3ML) 0.083% IN NEBU: 2.5000 mg | INHALATION_SOLUTION | RESPIRATORY_TRACT | Status: DC | PRN

## 2018-01-31 MED ORDER — INSULIN ASPART 100 UNIT/ML ~~LOC~~ SOLN: 0.0000 [IU] | Freq: Every day | SUBCUTANEOUS | Status: DC

## 2018-01-31 MED ORDER — DEXTROSE 50 % IV SOLN
25.0000 mL | Freq: Once | INTRAVENOUS | Status: AC
  Administered 2018-01-31: 25 mL via INTRAVENOUS
  Filled 2018-01-31: qty 50

## 2018-01-31 MED ORDER — SODIUM CHLORIDE 0.9 % IV BOLUS
500.0000 mL | Freq: Once | INTRAVENOUS | Status: AC
  Administered 2018-01-31: 500 mL via INTRAVENOUS

## 2018-01-31 MED ORDER — ACETAMINOPHEN 650 MG RE SUPP: 650.0000 mg | Freq: Four times a day (QID) | RECTAL | Status: DC | PRN

## 2018-01-31 MED ORDER — SODIUM CHLORIDE 0.9 % IV SOLN
INTRAVENOUS | Status: DC
  Administered 2018-01-31 – 2018-02-01 (×3): via INTRAVENOUS

## 2018-01-31 NOTE — H&P (Signed)
[facility] [place] [facility]   PATIENT NAME: Duane Turner    MR#:  [id number]  DATE OF BIRTH:  Jul 27, 1932  DATE OF ADMISSION:  01/31/2018  PRIMARY CARE PHYSICIAN: Patient, No Pcp Per   REQUESTING/REFERRING PHYSICIAN: Dr. Scotty Court.  CHIEF COMPLAINT:   Chief Complaint  Patient presents with  . Chest Pain   Chest pain since last night. HISTORY OF PRESENT ILLNESS:  Kaveh Kissinger  is a 84 y.o. male with a known history of hypertension, diabetes, hyperlipidemia, CVA, chronic systolic CHF, BPH, CKD, anemia and arthritis.  The patient is sent from nursing home to the ED due to above chief complaint.  The patient has very hard hearing and is not a good historian.  He only complains of chest pain substernal area since last night, which is dull and constant.  He has history of CVA with left-sided weakness, bedbound and wheelchair-bound.  He received nitroglycerin in nursing home which caused hypotension.  Blood pressure is fine in ED. troponin is 0.04.  PAST MEDICAL HISTORY:   Past Medical History:  Diagnosis Date  . Anemia   . Arthritis   . BPH (benign prostatic hyperplasia)   . Chronic systolic heart failure (HCC)   . Diabetes mellitus without complication (HCC)   . Dysphagia   . Hyperaldosteronism (HCC)   . Hypertension   . Renal disorder     PAST SURGICAL HISTORY:  History reviewed. No pertinent surgical history.  SOCIAL HISTORY:   Social History   Tobacco Use  . Smoking status: Never Smoker  . Smokeless tobacco: Never Used  Substance Use Topics  . Alcohol use: Not Currently    FAMILY HISTORY:  No family history on file.  Unable to obtain.  DRUG ALLERGIES:  No Known Allergies  REVIEW OF SYSTEMS:   Review of Systems  Unable to perform ROS: Medical condition    MEDICATIONS AT HOME:   Prior to Admission medications   Medication Sig Start Date End Date Taking? Authorizing Provider  aspirin (ASPIRIN EC) 81 MG EC tablet Take 81 mg by  mouth daily.      [provider]  glipiZIDE (GLUCOTROL) 10 MG tablet Take 10 mg by mouth 2 (two) times daily. For diabetes     [provider]  lisinopril (PRINIVIL,ZESTRIL) 20 MG tablet Take 20 mg by mouth at bedtime. For blood pressure     [provider]  metFORMIN (GLUCOPHAGE) 1000 MG tablet Take 1,000 mg by mouth 2 (two) times daily. For diabetes     [provider]  pravastatin (PRAVACHOL) 40 MG tablet Take 40 mg by mouth at bedtime.      [provider]  sitaGLIPtan (JANUVIA) 100 MG tablet Take 100 mg by mouth daily. For diabetes     [provider]      VITAL SIGNS:  Blood pressure (!) 127/95, pulse (!) 57, resp. rate 19, SpO2 97 %.  PHYSICAL EXAMINATION:  Physical Exam  GENERAL:  84 y.o.-year-old patient lying in the bed with no acute distress.  EYES: Pupils equal, round, reactive to light and accommodation. No scleral icterus. Extraocular muscles intact.   HEENT: Head atraumatic, normocephalic. Oropharynx and nasopharynx clear. Very hard of hearing. NECK:  Supple, no jugular venous distention. No thyroid enlargement, no tenderness.  LUNGS: Normal breath sounds bilaterally, no wheezing, rales,rhonchi or crepitation. No use of accessory muscles of respiration.  CARDIOVASCULAR: S1, S2 normal. No murmurs, rubs, or gallops.  ABDOMEN: Soft, nontender, nondistended. Bowel sounds present.  No organomegaly or mass.  EXTREMITIES: No pedal edema, cyanosis, or clubbing.  NEUROLOGIC: Cranial nerves II through XII are intact.  Unable to move both legs. PSYCHIATRIC: The patient is confused. SKIN: No obvious rash, lesion, or ulcer.   LABORATORY PANEL:   CBC Recent Labs  Lab 01/31/18 1732  WBC 2.5*  HGB 10.2*  HCT 30.6*  PLT 137*   ------------------------------------------------------------------------------------------------------------------  Chemistries  Recent Labs  Lab 01/31/18 1732  NA 132*  K 5.9*  CL 102  CO2 24    GLUCOSE 243*  BUN 76*  CREATININE 1.45*  CALCIUM 8.5*   ------------------------------------------------------------------------------------------------------------------  Cardiac Enzymes Recent Labs  Lab 01/31/18 1732  TROPONINI 0.04*   ------------------------------------------------------------------------------------------------------------------  RADIOLOGY:  Dg Abdomen 1 View  Result Date: 01/31/2018 CLINICAL DATA:  Abdominal distention. EXAM: ABDOMEN - 1 VIEW COMPARISON:  None. FINDINGS: Mild colonic gaseous distension, with moderate stool burden, including fecal impaction. No visible obstruction or free air. Negative osseous structures. No abnormal calcifications. IMPRESSION: Mild colonic gaseous distention with moderate stool burden including fecal impaction. Correlate clinically for constipation. Electronically Signed   By: Elsie Stain M.D.   On: 01/31/2018 18:15   Dg Chest Portable 1 View  Result Date: 01/31/2018 CLINICAL DATA:  Chest pain for 1 day. EXAM: PORTABLE CHEST 1 VIEW COMPARISON:  None. FINDINGS: Portable semierect radiograph. Cardiomegaly. Low lung volumes. Mild vascular congestion. No visible consolidation or edema. Osteopenia. IMPRESSION: Cardiomegaly.  No definite active infiltrates or failure. Electronically Signed   By: Elsie Stain M.D.   On: 01/31/2018 18:14      IMPRESSION AND PLAN:   Hyperkalemia with bradycardia. Patient will be admitted to medical floor. Telemetry monitor, give calcium gluconate IV, NovoLog and D50 IV, IV fluid support and follow-up BMP.  Hold lisinopril.  Acute renal failure on CKD stage III.  Start normal saline IV and follow-up BMP, hold lisinopril.  Chest pain, atypical, mild elevated troponin.  Possible due to renal failure, Follow-up troponin level, increase aspirin to 325 mg p.o. daily.  Continue statin, nitro as needed.  Hyponatremia.  Normal saline IV and follow-up BMP.  History of CVA with left-sided weakness.   Continue aspirin understanding.  Hypertension.  Hold lisinopril due to renal failure.  Diabetes.  Hold p.o. medication and start sliding scale, check hemoglobin A1c.  All the records are reviewed and case discussed with ED provider. Management plans discussed with the patient, his son and they are in agreement.  CODE STATUS: DNR  TOTAL TIME TAKING CARE OF THIS PATIENT: 37 minutes.    Shaune Pollack M.D on 01/31/2018 at 7:33 PM  Between 7am to 6pm - Pager - [phone]  After 6pm go to [email or web address] - Social research officer, government  [facility] [place] [facility]  Office  [phone]  CC: Primary care physician; Patient, No Pcp Per   Note: This dictation was prepared with Dragon dictation along with smaller phrase technology. Any transcriptional errors that result from this process are unin

## 2018-01-31 NOTE — ED Triage Notes (Signed)
Pt to ED via [facility] from [facility] care for CP x 1 day. Had was given 324 mg of ASA and 2 NTG tablets. VS with EMS as follows BP 115/73, 60- HR, Pt is in NAD at this time.

## 2018-01-31 NOTE — Progress Notes (Signed)
Advanced Care Plan.  Purpose of Encounter: CODE STATUS. Parties in Attendance: The patient, his son, daughter-in-law and me. Patient's Decisional Capacity: No. Medical Story: Duane Turner  is a 84 y.o. male with a known history of hypertension, diabetes, hyperlipidemia, CVA, chronic systolic CHF, BPH, CKD, anemia and arthritis.  He is sent from nursing home to ED due to chest pain.  He is admitted for hyperkalemia, renal failure and the chest pain.  I discussed with the patient and his son about his current condition, prognosis and CODE STATUS.  The patient's son said that the patient's wish is not resuscitation or intubation. Plan:  Code Status: DNR. Time spent discussing advance care planning: 16-17 minutes.

## 2018-01-31 NOTE — ED Provider Notes (Addendum)
[facility] Emergency Department Provider Note  ____________________________________________  Time seen: Approximately 6:58 PM  I have reviewed the triage vital signs and the nursing notes.   HISTORY  Chief Complaint Chest Pain  Level 5 Caveat: Portions of the History and Physical including HPI and review of systems are unable to be completely obtained due to patient being a poor historian    HPI Duane Turner is a 84 y.o. male   with a history of BPH, systolic heart failure, diabetes that is poorly controlled, hypertension who was sent to the ED today due to chest pain over the past 24 hours, intermittent.  Patient is wheelchair-bound, unable to see if it is exertional.  Not pleuritic.  Nonradiating.  Moderate intensity, aching.  No shortness of breath vomiting or diaphoresis.  Nursing home gave him 2 nitroglycerin which caused hypotension.  EMS found normal vital signs throughout their transport.  Initial EKG by EMS was nondiagnostic.     Past Medical History:  Diagnosis Date  . Anemia   . Arthritis   . BPH (benign prostatic hyperplasia)   . Chronic systolic heart failure (HCC)   . Diabetes mellitus without complication (HCC)   . Dysphagia   . Hyperaldosteronism (HCC)   . Hypertension   . Renal disorder      Patient Active Problem List   Diagnosis Date Noted  . DIABETES MELLITUS, TYPE II, UNCONTROLLED 05/17/2009  . HYPERTENSION, BENIGN ESSENTIAL 05/17/2009  . TOBACCO USER 01/29/2009  . ERECTILE DYSFUNCTION 12/23/2007  . HYPERLIPIDEMIA 11/29/2006  . ANEMIA NOS 11/01/2006  . PROTEINURIA, MILD 11/01/2006     History reviewed. No pertinent surgical history.   Prior to Admission medications   Medication Sig Start Date End Date Taking? Authorizing Provider  aspirin (ASPIRIN EC) 81 MG EC tablet Take 81 mg by mouth daily.      [provider]  glipiZIDE (GLUCOTROL) 10 MG tablet Take 10 mg by mouth 2 (two) times daily. For diabetes      [provider]  lisinopril (PRINIVIL,ZESTRIL) 20 MG tablet Take 20 mg by mouth at bedtime. For blood pressure     [provider]  metFORMIN (GLUCOPHAGE) 1000 MG tablet Take 1,000 mg by mouth 2 (two) times daily. For diabetes     [provider]  pravastatin (PRAVACHOL) 40 MG tablet Take 40 mg by mouth at bedtime.      [provider]  sitaGLIPtan (JANUVIA) 100 MG tablet Take 100 mg by mouth daily. For diabetes     [provider]     Allergies Patient has no known allergies.   No family history on file.  Social History Social History   Tobacco Use  . Smoking status: Never Smoker  . Smokeless tobacco: Never Used  Substance Use Topics  . Alcohol use: Not Currently  . Drug use: Not Currently    Review of Systems  Constitutional:   No fever or chills.  ENT:   No sore throat. No rhinorrhea. Cardiovascular: Positive chest pain and dizziness without syncope. Respiratory:   No dyspnea or cough. Gastrointestinal:   Negative for abdominal pain, vomiting and diarrhea.  Musculoskeletal:   Negative for focal pain or swelling All other systems reviewed and are negative except as documented above in ROS and HPI.  ____________________________________________   PHYSICAL EXAM:  VITAL SIGNS: ED Triage Vitals [01/31/18 1730]  Enc Vitals Group     BP 123/78     Pulse Rate (!) 58     Resp  17     Temp      Temp Source Axillary     SpO2 95 %     Weight      Height      Head Circumference      Peak Flow      Pain Score      Pain Loc      Pain Edu?      Excl. in GC?     Vital signs reviewed, nursing assessments reviewed.   Constitutional:   Alert and oriented.  Chronically ill-appearing Eyes:   Conjunctivae are normal. EOMI. PERRL. ENT      Head:   Normocephalic and atraumatic.      Nose:   No congestion/rhinnorhea.       Mouth/Throat:   Dry mucous membranes, no pharyngeal erythema. No peritonsillar mass.       Neck:   No  meningismus. Full ROM. Hematological/Lymphatic/Immunilogical:   No cervical lymphadenopathy. Cardiovascular:   RRR. Symmetric bilateral radial and DP pulses.  No murmurs. Cap refill less than 2 seconds. Respiratory:   Normal respiratory effort without tachypnea/retractions. Breath sounds are clear and equal bilaterally. No wheezes/rales/rhonchi. Gastrointestinal:   Soft and nontender. Non distended. There is no CVA tenderness.  No rebound, rigidity, or guarding. Musculoskeletal:   Normal range of motion in all extremities. No joint effusions.  No lower extremity tenderness.  No edema. Neurologic:   Normal speech and language.  Motor grossly intact. No acute focal neurologic deficits are appreciated.  Skin:    Skin is warm, dry and intact. No rash noted.  No petechiae, purpura, or bullae.  ____________________________________________    LABS (pertinent positives/negatives) (all labs ordered are listed, but only abnormal results are displayed) Labs Reviewed  BASIC METABOLIC PANEL - Abnormal; Notable for the following components:      Result Value   Sodium 132 (*)    Potassium 5.9 (*)    Glucose, Bld 243 (*)    BUN 76 (*)    Creatinine, Ser 1.45 (*)    Calcium 8.5 (*)    GFR calc non Af Amer 43 (*)    GFR calc Af Amer 50 (*)    All other components within normal limits  CBC - Abnormal; Notable for the following components:   WBC 2.5 (*)    RBC 3.36 (*)    Hemoglobin 10.2 (*)    HCT 30.6 (*)    Platelets 137 (*)    All other components within normal limits  TROPONIN I - Abnormal; Notable for the following components:   Troponin I 0.04 (*)    All other components within normal limits   ____________________________________________   EKG  Interpreted by me Sinus rhythm, rate of 63, normal axis and intervals.  Normal QRS ST segments and T waves.  Wavy baseline with lots of artifact limits interpretation.  ____________________________________________    RADIOLOGY  Dg  Abdomen 1 View  Result Date: 01/31/2018 CLINICAL DATA:  Abdominal distention. EXAM: ABDOMEN - 1 VIEW COMPARISON:  None. FINDINGS: Mild colonic gaseous distension, with moderate stool burden, including fecal impaction. No visible obstruction or free air. Negative osseous structures. No abnormal calcifications. IMPRESSION: Mild colonic gaseous distention with moderate stool burden including fecal impaction. Correlate clinically for constipation. Electronically Signed   By: Elsie Stain M.D.   On: 01/31/2018 18:15   Dg Chest Portable 1 View  Result Date: 01/31/2018 CLINICAL DATA:  Chest pain for 1 day. EXAM: PORTABLE CHEST 1 VIEW COMPARISON:  None.  FINDINGS: Portable semierect radiograph. Cardiomegaly. Low lung volumes. Mild vascular congestion. No visible consolidation or edema. Osteopenia. IMPRESSION: Cardiomegaly.  No definite active infiltrates or failure. Electronically Signed   By: Elsie Stain M.D.   On: 01/31/2018 18:14    ____________________________________________   PROCEDURES Procedures  ____________________________________________  DIFFERENTIAL DIAGNOSIS   Pulmonary edema, pneumonia, pneumothorax, non-STEMI  CLINICAL IMPRESSION / ASSESSMENT AND PLAN / ED COURSE  Pertinent labs & imaging results that were available during my care of the patient were reviewed by me and considered in my medical decision making (see chart for details).    Patient presents with central chest pain, hard to characterize.  Vital signs are unremarkable.  He has significant comorbidities, higher risk for cardiac event.  Initial troponin 0 0.04.  He was unable to tolerate nitroglycerin.  Plan to hospitalize for further observation on telemetry and serial troponins.  Chemistry shows slight acute renal insufficiency with a creatinine of 1.45, potassium of 5.9, likely dehydration.  I give gentle IV fluids given his history of heart failure.      ____________________________________________   FINAL  CLINICAL IMPRESSION(S) / ED DIAGNOSES    Final diagnoses:  Chest pain with moderate risk for cardiac etiology  Acute renal insufficiency     ED Discharge Orders    None      Portions of this note were generated with dragon dictation software. Dictation errors may occur despite best attempts at proofreading.    Sharman Cheek, MD 01/31/18 1901    Sharman Cheek, MD 01/31/18 [phone]

## 2018-01-31 NOTE — ED Notes (Signed)
Pt is resting in bed. Respirations even/unlabored. A&OX4. nadn

## 2018-02-01 ENCOUNTER — Inpatient Hospital Stay: Payer: Medicare Other

## 2018-02-01 ENCOUNTER — Inpatient Hospital Stay
Admit: 2018-02-01 | Discharge: 2018-02-01 | Disposition: A | Discharge status: Home / Self Care | Payer: Medicare Other | Attending: Internal Medicine | Admitting: Internal Medicine

## 2018-02-01 DIAGNOSIS — R001 Bradycardia, unspecified: Secondary | ICD-10-CM

## 2018-02-01 DIAGNOSIS — N289 Disorder of kidney and ureter, unspecified: Secondary | ICD-10-CM

## 2018-02-01 DIAGNOSIS — E875 Hyperkalemia: Principal | ICD-10-CM

## 2018-02-01 DIAGNOSIS — I5022 Chronic systolic (congestive) heart failure: Secondary | ICD-10-CM

## 2018-02-01 LAB — GLUCOSE, CAPILLARY
GLUCOSE-CAPILLARY: 119 mg/dL — AB (ref 70–99)
GLUCOSE-CAPILLARY: 71 mg/dL (ref 70–99)
GLUCOSE-CAPILLARY: 69 mg/dL — AB (ref 70–99)
Glucose-Capillary: 181 mg/dL — ABNORMAL HIGH (ref 70–99)
Glucose-Capillary: 186 mg/dL — ABNORMAL HIGH (ref 70–99)
Glucose-Capillary: 190 mg/dL — ABNORMAL HIGH (ref 70–99)
Glucose-Capillary: 42 mg/dL — CL (ref 70–99)
Glucose-Capillary: 72 mg/dL (ref 70–99)

## 2018-02-01 LAB — BASIC METABOLIC PANEL
ANION GAP: 5 (ref 5–15)
BUN: 77 mg/dL — ABNORMAL HIGH (ref 8–23)
CHLORIDE: 104 mmol/L (ref 98–111)
CO2: 26 mmol/L (ref 22–32)
CREATININE: 1.57 mg/dL — AB (ref 0.61–1.24)
Calcium: 8.7 mg/dL — ABNORMAL LOW (ref 8.9–10.3)
GFR calc non Af Amer: 39 mL/min — ABNORMAL LOW (ref >60)
GFR, EST AFRICAN AMERICAN: 46 mL/min — AB (ref >60)
GLUCOSE: 58 mg/dL — AB (ref 70–99)
Potassium: 4.5 mmol/L (ref 3.5–5.1)
Sodium: 135 mmol/L (ref 135–145)

## 2018-02-01 LAB — HEMOGLOBIN A1C
Hgb A1c MFr Bld: 6.6 % — ABNORMAL HIGH (ref 4.8–5.6)
MEAN PLASMA GLUCOSE: 142.72 mg/dL

## 2018-02-01 LAB — CBC
HEMATOCRIT: 30.1 % — AB (ref 39.0–52.0)
Hemoglobin: 9.9 g/dL — ABNORMAL LOW (ref 13.0–17.0)
MCH: 29.7 pg (ref 26.0–34.0)
MCHC: 32.9 g/dL (ref 30.0–36.0)
MCV: 90.4 fL (ref 80.0–100.0)
NRBC: 0 % (ref 0.0–0.2)
Platelets: 127 10*3/uL — ABNORMAL LOW (ref 150–400)
RBC: 3.33 MIL/uL — AB (ref 4.22–5.81)
RDW: 15 % (ref 11.5–15.5)
WBC: 2.4 10*3/uL — ABNORMAL LOW (ref 4.0–10.5)

## 2018-02-01 LAB — TSH: TSH: 5.747 u[IU]/mL — AB (ref 0.350–4.500)

## 2018-02-01 LAB — T4, FREE: FREE T4: 0.95 ng/dL (ref 0.82–1.77)

## 2018-02-01 LAB — TROPONIN I: Troponin I: 0.05 ng/mL (ref <0.03)

## 2018-02-01 LAB — MRSA PCR SCREENING: MRSA BY PCR: NEGATIVE

## 2018-02-01 MED ORDER — SALINE SPRAY 0.65 % NA SOLN
1.0000 | NASAL | Status: DC | PRN
  Administered 2018-02-01: 1 via NASAL
  Filled 2018-02-01: qty 44

## 2018-02-01 MED ORDER — DEXTROSE 50 % IV SOLN
1.0000 | Freq: Once | INTRAVENOUS | Status: AC
  Administered 2018-02-01: 50 mL via INTRAVENOUS
  Filled 2018-02-01: qty 50

## 2018-02-01 NOTE — Consult Note (Signed)
Cardiology Consultation:   Patient ID: Duane Turner MRN: [id number]; DOB: [birth year]  Admit date: 01/31/2018 Date of Consult: 02/01/2018  Primary Care Provider: Patient, No Pcp Per Primary Cardiologist: No primary care provider on file.  --However he carries a diagnosis of chronic systolic heart failure Primary Electrophysiologist:  None    Patient Profile:   Duane Turner is a 84 y.o. male with a reported past medical history of chronic systolic heart failure along with hypertension, diabetes and stroke who is being seen today for the evaluation of bradycardia in setting of chest pain at the request of Adrian Saran, MD   History of Present Illness:   Duane Turner  was sent from nursing home to the emergency room for chief complaint of chest pain.  Patient himself is a poor historian with chronic left-sided weakness bed/wheelchair bound.  Had minimal troponin elevation of 0.04 and 0.05 in the setting of worsening renal function.  Upon IM evaluation, patient was no longer noting any chest pain.  ACE inhibitor was held due to worsening renal function and gentle IV fluid was administered. Was noted to be hyperkalemic on admission, now resolved. Overnight telemetry demonstrated heart rates as low as 33 bpm in the setting of sinus bradycardia and sleeping.  On exam, the patient is able to tell me that he lives in [place] in a rehab place.  He gives me a room number.  Able to follow some commands, but not really answering questions as far as symptoms go.  He says his legs hurt.  Denies any chest pain.   Past Medical History:  Diagnosis Date  . Anemia   . Arthritis   . BPH (benign prostatic hyperplasia)   . Chronic systolic heart failure (HCC)   . Diabetes mellitus without complication (HCC)   . Dysphagia   . Hyperaldosteronism (HCC)   . Hypertension   . Renal disorder   --No knowledge of cardiac issue.  No notes available.  History reviewed. No pertinent surgical history.  Unable  to obtain and no records available.  Home Medications:  Prior to Admission medications   Medication Sig Start Date End Date Taking? Authorizing Provider  aspirin (ASPIRIN EC) 81 MG EC tablet Take 81 mg by mouth daily.      [provider]  glipiZIDE (GLUCOTROL) 10 MG tablet Take 10 mg by mouth 2 (two) times daily. For diabetes     [provider]  lisinopril (PRINIVIL,ZESTRIL) 20 MG tablet Take 20 mg by mouth at bedtime. For blood pressure     [provider]  metFORMIN (GLUCOPHAGE) 1000 MG tablet Take 1,000 mg by mouth 2 (two) times daily. For diabetes     [provider]  pravastatin (PRAVACHOL) 40 MG tablet Take 40 mg by mouth at bedtime.      [provider]  sitaGLIPtan (JANUVIA) 100 MG tablet Take 100 mg by mouth daily. For diabetes     [provider]    Inpatient Medications: Scheduled Meds: . aspirin EC  325 mg Oral Daily  . heparin  5,000 Units Subcutaneous Q8H  . insulin aspart  0-5 Units Subcutaneous QHS  . insulin aspart  0-9 Units Subcutaneous TID WC  . pravastatin  40 mg Oral QHS   Continuous Infusions: . sodium chloride 75 mL/hr at 01/31/18 2230   PRN Meds: acetaminophen **OR** acetaminophen, albuterol, bisacodyl, HYDROcodone-acetaminophen, nitroGLYCERIN, ondansetron **OR** ondansetron (ZOFRAN) IV, senna-docusate  Allergies:   No Known Allergies  Social History:   Social History   Socioeconomic  History  . Marital status: Widowed    Spouse name: Not on file  . Number of children: Not on file  . Years of education: Not on file  . Highest education level: Not on file  Occupational History  . Not on file  Social Needs  . Financial resource strain: Not on file  . Food insecurity:    Worry: Not on file    Inability: Not on file  . Transportation needs:    Medical: Not on file    Non-medical: Not on file  Tobacco Use  . Smoking status: Never Smoker  . Smokeless tobacco: Never Used  Substance and Sexual  Activity  . Alcohol use: Not Currently  . Drug use: Not Currently  . Sexual activity: Not on file  Lifestyle  . Physical activity:    Days per week: Not on file    Minutes per session: Not on file  . Stress: Not on file  Relationships  . Social connections:    Talks on phone: Not on file    Gets together: Not on file    Attends religious service: Not on file    Active member of club or organization: Not on file    Attends meetings of clubs or organizations: Not on file    Relationship status: Not on file  . Intimate partner violence:    Fear of current or ex partner: Not on file    Emotionally abused: Not on file    Physically abused: Not on file    Forced sexual activity: Not on file  Other Topics Concern  . Not on file  Social History Narrative  . Not on file    Family History:   Patient is a poor historian.  Unable to obtain and no data in the chart. No family history on file.  Unable to obtain  ROS:  Please see the history of present illness Unable to obtain.  Poor historian  Physical Exam/Data:   Vitals:   01/31/18 2000 01/31/18 2034 02/01/18 0516 02/01/18 0740  BP: (!) 124/97 119/80 96/70 103/72  Pulse: (!) 58 (!) 48 (!) 46 (!) 50  Resp: 17 18 18    TempSrc:      SpO2: 100% 98% 94% 95%  Weight:  81.7 kg      Intake/Output Summary (Last 24 hours) at 02/01/2018 1014 Last data filed at 02/01/2018 0500 Gross per 24 hour  Intake 500 ml  Output 0 ml  Net 500 ml   Filed Weights   01/31/18 2034  Weight: 81.7 kg   Body mass index is 29.07 kg/m.  General: Surprisingly healthy-appearing but confused.  No acute distress. HEENT: [place]/AT.  Very poor dentition. Neck: no JVD, no adenopathy, No thryomegaly; no carotid bruit.  Palpable femoral pulses but minimal pedal pulses  Cardiac: Bradycardia with normal S1, S2; RRR; harsh 1-2/6 systolic murmur heard throughout but seems to be SEM. Lungs: Mostly clear but coarse breath sounds.  No obvious rales or rhonchi. Abd:  Firm mildly distended abdomen but nontender.  Tympanitic. Ext: Bilateral lower extremity edema at least 1-2+ with diffuse venous stasis changes and both dermatitis and with multiple areas of healing ulcers. Musculoskeletal: Left-sided paresis. Skin: warm and dry  Neuro: Difficult to assess.  Left hemiparesis.  Minimally follows commands therefore difficult to do full neuro exam. Psych: Blunted affect.    EKG:  The EKG from October 11 was personally reviewed and demonstrates: Regular rhythm in the 60s, unable to determine underlying rhythm or any  particular ST and T wave changes because of extensive artifact.  Poor quality EKG. Telemetry:  Telemetry was personally reviewed and demonstrates: Sinus bradycardia, rates currently high 40s to low 50s, overnight was low was 33-39 bpm sinus bradycardia.  No obvious pauses.  This was at roughly 5 in the morning (4:50 AM).  Relevant CV Studies: None available.  Echo pending  Laboratory Data:  Chemistry Recent Labs  Lab 01/31/18 1732 02/01/18 0221  NA 132* 135  K 5.9* 4.5  CL 102 104  CO2 24 26  GLUCOSE 243* 58*  BUN 76* 77*  CREATININE 1.45* 1.57*  CALCIUM 8.5* 8.7*  GFRNONAA 43* 39*  GFRAA 50* 46*  ANIONGAP 6 5    No results for input(s): PROT, ALBUMIN, AST, ALT, ALKPHOS, BILITOT in the last 168 hours. Hematology Recent Labs  Lab 01/31/18 1732 02/01/18 0221  WBC 2.5* 2.4*  RBC 3.36* 3.33*  HGB 10.2* 9.9*  HCT 30.6* 30.1*  MCV 91.1 90.4  MCH 30.4 29.7  MCHC 33.3 32.9  RDW 15.2 15.0  PLT 137* 127*   Cardiac Enzymes Recent Labs  Lab 01/31/18 1732 02/01/18 0221  TROPONINI 0.04* 0.05*   No results for input(s): TROPIPOC in the last 168 hours.  BNPNo results for input(s): BNP, PROBNP in the last 168 hours.  DDimer No results for input(s): DDIMER in the last 168 hours.  Radiology/Studies:  Dg Abdomen 1 View  Result Date: 01/31/2018 CLINICAL DATA:  Abdominal distention. EXAM: ABDOMEN - 1 VIEW COMPARISON:  None. FINDINGS:  Mild colonic gaseous distension, with moderate stool burden, including fecal impaction. No visible obstruction or free air. Negative osseous structures. No abnormal calcifications. IMPRESSION: Mild colonic gaseous distention with moderate stool burden including fecal impaction. Correlate clinically for constipation. Electronically Signed   By: Elsie Stain M.D.   On: 01/31/2018 18:15   US Venous Img Lower Bilateral  Result Date: 02/01/2018 CLINICAL DATA:  Bilateral pain EXAM: BILATERAL LOWER EXTREMITY VENOUS DOPPLER ULTRASOUND TECHNIQUE: Gray-scale sonography with compression, as well as color and duplex ultrasound, were performed to evaluate the deep venous system from the level of the common femoral vein through the popliteal and proximal calf veins. COMPARISON:  None FINDINGS: Normal compressibility of the common femoral, superficial femoral, and popliteal veins, as well as the proximal calf veins. No filling defects to suggest DVT on grayscale or color Doppler imaging. Doppler waveforms show normal direction of venous flow, normal respiratory phasicity and response to augmentation. Subcutaneous edema in bilateral calves. IMPRESSION: No evidence of  lower extremity deep vein thrombosis. Electronically Signed   By: Corlis Leak M.D.   On: 02/01/2018 09:24   Dg Chest Portable 1 View  Result Date: 01/31/2018 CLINICAL DATA:  Chest pain for 1 day. EXAM: PORTABLE CHEST 1 VIEW COMPARISON:  None. FINDINGS: Portable semierect radiograph. Cardiomegaly. Low lung volumes. Mild vascular congestion. No visible consolidation or edema. Osteopenia. IMPRESSION: Cardiomegaly.  No definite active infiltrates or failure. Electronically Signed   By: Elsie Stain M.D.   On: 01/31/2018 18:14    Assessment and Plan:   1. Chest pain: Minimal troponin elevation likely related more toward renal insufficiency than demand ischemia. -->  No prior history known as there is no data available.  Echocardiogram pending.   would not  pursue ischemic evaluation at this time --due to patient's frailty, inability to provide history and no old data to compare to. 2. Sinus bradycardia: Currently rates in the high 40s to 50s, did go down as low as 30s overnight  while sleeping.  Likely asymptomatic and could be related to hyperkalemia.    We will continue to monitor, no indication for pacemaker placement at this time. 3. Has history of hypertension, but borderline hypotensive here today.  ACE inhibitor on hold for both renal insufficiency, hyperkalemia and hypotension. 4. Renal insufficiency (unsure if acute versus acute on chronic).  Initially treated with IV fluids.  Potassium and sodium improved, however creatinine worsened.  We will follow along, but no active recommendations at this time. Once echocardiogram has been read, will be able to provide more information.  If any outpatient records are available this would help as well  CODE STATUS: DNR.  For questions or updates, please contact [facility] Please consult [email or web address] for contact info under     Signed, Bryan Lemma, MD  02/01/2018 10:14 AM

## 2018-02-01 NOTE — Progress Notes (Signed)
Pt continues to c/o leg pain. States his legs do not usually swell this much.  1+ pitting edema.  On-call MD paged.

## 2018-02-01 NOTE — Progress Notes (Addendum)
[facility] [place] at [facility]   PATIENT NAME: Duane Turner    MR#:  [id number]  DATE OF BIRTH:  Dec 12, 1932  SUBJECTIVE:   Patient presented from [facility] due to chest pain.  Patient is not a very good historian but does deny chest pain.  REVIEW OF SYSTEMS:    unable to obtain as patient is very poor historian   Tolerating Diet: yes      DRUG ALLERGIES:  No Known Allergies  VITALS:  Blood pressure 103/72, pulse (!) 50, resp. rate 18, weight 81.7 kg, SpO2 95 %.  PHYSICAL EXAMINATION:  Constitutional: Appears well-developed and well-nourished. No distress. HENT: Normocephalic. Marland Kitchen Oropharynx is clear and moist.  Eyes: Conjunctivae and EOM are normal. PERRLA, no scleral icterus.  Neck: Normal ROM. Neck supple. No JVD. No tracheal deviation. CVS: RRR, S1/S2 +, no murmurs, no gallops, no carotid bruit.  Pulmonary: Effort and breath sounds normal, no stridor, rhonchi, wheezes, rales.  Abdominal: Soft. BS +,  no distension, tenderness, rebound or guarding.  Musculoskeletal: Normal range of motion. No edema and no tenderness.  Neuro: Alert. CN 2-12 grossly intact. No focal deficits. Skin: Skin is warm and dry. Chronic venous stasis changes left LE dry scaly Psychiatric: Normal mood and affect.      LABORATORY PANEL:   CBC Recent Labs  Lab 02/01/18 0221  WBC 2.4*  HGB 9.9*  HCT 30.1*  PLT 127*   ------------------------------------------------------------------------------------------------------------------  Chemistries  Recent Labs  Lab 02/01/18 0221  NA 135  K 4.5  CL 104  CO2 26  GLUCOSE 58*  BUN 77*  CREATININE 1.57*  CALCIUM 8.7*   ------------------------------------------------------------------------------------------------------------------  Cardiac Enzymes Recent Labs  Lab 01/31/18 1732 02/01/18 0221  TROPONINI 0.04* 0.05*    ------------------------------------------------------------------------------------------------------------------  RADIOLOGY:  Dg Abdomen 1 View  Result Date: 01/31/2018 CLINICAL DATA:  Abdominal distention. EXAM: ABDOMEN - 1 VIEW COMPARISON:  None. FINDINGS: Mild colonic gaseous distension, with moderate stool burden, including fecal impaction. No visible obstruction or free air. Negative osseous structures. No abnormal calcifications. IMPRESSION: Mild colonic gaseous distention with moderate stool burden including fecal impaction. Correlate clinically for constipation. Electronically Signed   By: Elsie Stain M.D.   On: 01/31/2018 18:15   US Venous Img Lower Bilateral  Result Date: 02/01/2018 CLINICAL DATA:  Bilateral pain EXAM: BILATERAL LOWER EXTREMITY VENOUS DOPPLER ULTRASOUND TECHNIQUE: Gray-scale sonography with compression, as well as color and duplex ultrasound, were performed to evaluate the deep venous system from the level of the common femoral vein through the popliteal and proximal calf veins. COMPARISON:  None FINDINGS: Normal compressibility of the common femoral, superficial femoral, and popliteal veins, as well as the proximal calf veins. No filling defects to suggest DVT on grayscale or color Doppler imaging. Doppler waveforms show normal direction of venous flow, normal respiratory phasicity and response to augmentation. Subcutaneous edema in bilateral calves. IMPRESSION: No evidence of  lower extremity deep vein thrombosis. Electronically Signed   By: Corlis Leak M.D.   On: 02/01/2018 09:24   Dg Chest Portable 1 View  Result Date: 01/31/2018 CLINICAL DATA:  Chest pain for 1 day. EXAM: PORTABLE CHEST 1 VIEW COMPARISON:  None. FINDINGS: Portable semierect radiograph. Cardiomegaly. Low lung volumes. Mild vascular congestion. No visible consolidation or edema. Osteopenia. IMPRESSION: Cardiomegaly.  No definite active infiltrates or failure. Electronically Signed   By: Elsie Stain M.D.   On: 01/31/2018 18:14     ASSESSMENT AND PLAN:   84 year old male with history  of chronic systolic heart failure, diabetes and chronic kidney disease stage III who presented from nursing home due to chest pain.  1.  Chest pain: Patient has ruled out for ACS. He is currently chest pain-free.   Slight troponin elevation due to demand ischemia.    2.  Bradycardia: I will consult cardiology for further evaluation TSH is elevated and therefore will check T3 and T4 Not on beta-blockers as an outpatient. Check echocardiogram  3.  Diabetes: Continue ADA diet with sliding scale  4.  Essential hypertension: Blood pressure is low normal and therefore lisinopril has been discontinued for now  5.  Chronic kidney disease stage III: Creatinine seems to be at baseline  6. hyperkalemia: This has been resolved 7. LEE chronic venous changes: wound care consult placed.   Management plans discussed with the patient and he is in agreement.  CODE STATUS: dnr  TOTAL TIME TAKING CARE OF THIS PATIENT: 29 minutes   POSSIBLE D/C 1-2 days, DEPENDING ON CLINICAL CONDITION.   Mclean Moya M.D on 02/01/2018 at 9:30 AM  Between 7am to 6pm - Pager - [phone] After 6pm go to [email or web address] - password [email or web address]  [facility] [place] [facility]  Office  [phone]  CC: Primary care physician; Patient, No Pcp Per  Note: This dictation was prepared with Dragon dictation along with smaller phrase technology. Any transcriptional errors that result from this process are unintentional.

## 2018-02-02 LAB — GLUCOSE, CAPILLARY
GLUCOSE-CAPILLARY: 68 mg/dL — AB (ref 70–99)
Glucose-Capillary: 119 mg/dL — ABNORMAL HIGH (ref 70–99)
Glucose-Capillary: 121 mg/dL — ABNORMAL HIGH (ref 70–99)

## 2018-02-02 LAB — ECHOCARDIOGRAM COMPLETE: WEIGHTICAEL: 2881.85 [oz_av]

## 2018-02-02 LAB — BASIC METABOLIC PANEL
Anion gap: 6 (ref 5–15)
BUN: 81 mg/dL — AB (ref 8–23)
CO2: 24 mmol/L (ref 22–32)
CREATININE: 1.61 mg/dL — AB (ref 0.61–1.24)
Calcium: 8.3 mg/dL — ABNORMAL LOW (ref 8.9–10.3)
Chloride: 103 mmol/L (ref 98–111)
GFR, EST AFRICAN AMERICAN: 44 mL/min — AB (ref >60)
GFR, EST NON AFRICAN AMERICAN: 38 mL/min — AB (ref >60)
Glucose, Bld: 82 mg/dL (ref 70–99)
POTASSIUM: 4.7 mmol/L (ref 3.5–5.1)
SODIUM: 133 mmol/L — AB (ref 135–145)

## 2018-02-02 MED ORDER — INSULIN ASPART 100 UNIT/ML ~~LOC~~ SOLN: 0.0000 [IU] | Freq: Three times a day (TID) | SUBCUTANEOUS | 11 refills | Status: DC

## 2018-02-02 MED ORDER — INSULIN ASPART 100 UNIT/ML ~~LOC~~ SOLN: 0.0000 [IU] | Freq: Every day | SUBCUTANEOUS | 11 refills | Status: DC

## 2018-02-02 MED ORDER — LISINOPRIL 10 MG PO TABS: 10.0000 mg | ORAL_TABLET | Freq: Every day | ORAL | 0 refills | Status: DC

## 2018-02-02 NOTE — Progress Notes (Signed)
Duane Turner to be D/C'd Skilled nursing facility per MD order.  [facility], Report given to [facility], LPN.   Allergies as of 02/02/2018   No Known Allergies     Medication List    STOP taking these medications   GLUCOTROL 10 MG tablet Generic drug:  glipiZIDE   metFORMIN 1000 MG tablet Commonly known as:  GLUCOPHAGE   sitaGLIPtin 100 MG tablet Commonly known as:  JANUVIA     TAKE these medications   aspirin EC 81 MG EC tablet Generic drug:  aspirin Take 81 mg by mouth daily.   insulin aspart 100 UNIT/ML injection Commonly known as:  novoLOG Inject 0-5 Units into the skin at bedtime. CBG < 70: implement hypoglycemia protocol  CBG 70 - 120: 0 units  CBG 121 - 150: 0 units  CBG 151 - 200: 0 units  CBG 201 - 250: 2 units  CBG 251 - 300: 3 units  CBG 301 - 350: 4 units  CBG 351 - 400: 5 units  CBG > 400 call MD   insulin aspart 100 UNIT/ML injection Commonly known as:  novoLOG Inject 0-9 Units into the skin 3 (three) times daily with meals. CBG < 70: implement hypoglycemia protocol  CBG 70 - 120: 0 units  CBG 121 - 150: 1 unit  CBG 151 - 200: 2 units  CBG 201 - 250: 3 units  CBG 251 - 300: 5 units  CBG 301 - 350: 7 units  CBG 351 - 400 9 units  CBG > 400 call MD   lisinopril 10 MG tablet Commonly known as:  PRINIVIL,ZESTRIL Take 1 tablet (10 mg total) by mouth at bedtime. For blood pressure What changed:    medication strength  how much to take   pravastatin 40 MG tablet Commonly known as:  PRAVACHOL Take 40 mg by mouth at bedtime.       Vitals:   02/02/18 0400 02/02/18 0806  BP: 98/72 113/69  Pulse: (!) 49 (!) 47  Resp: 16 18  Temp: 98.6 F (37 C) 98.6 F (37 C)  SpO2: 97% 94%    Tele box removed and returned. Skin clean, dry and intact without evidence of skin break down, no evidence of skin tears noted. IV catheter discontinued intact. Site without signs and symptoms of complications. Dressing and pressure applied. Pt denies pain at  this time. No complaints noted.   Patient escorted via strecher and D/C to SNF via [facility]. Rigoberto Noel

## 2018-02-02 NOTE — NC FL2 (Addendum)
[place] MEDICAID FL2 LEVEL OF CARE SCREENING TOOL     IDENTIFICATION  Patient Name: Duane Turner Birthdate: 04-19-1933 Sex: male Admission Date (Current Location): 01/31/2018  [place] and [place] Number:  Chiropodist and Address:  [facility], [address], [place], [place] [address]      Provider Number: [id number]  Attending Physician Name and Address:  Adrian Saran, MD  Relative Name and Phone Number:  Blinda Leatherwood [phone]    Current Level of Care: Hospital Recommended Level of Care: Skilled Nursing Facility Prior Approval Number:    Date Approved/Denied:   PASRR Number: [id number]  Discharge Plan: SNF    Current Diagnoses: Patient Active Problem List   Diagnosis Date Noted  . Hyperkalemia 01/31/2018  . DIABETES MELLITUS, TYPE II, UNCONTROLLED 05/17/2009  . HYPERTENSION, BENIGN ESSENTIAL 05/17/2009  . TOBACCO USER 01/29/2009  . ERECTILE DYSFUNCTION 12/23/2007  . HYPERLIPIDEMIA 11/29/2006  . ANEMIA NOS 11/01/2006  . PROTEINURIA, MILD 11/01/2006    Orientation RESPIRATION BLADDER Height & Weight     Time, Self, Situation, Place  Normal Incontinent Weight: 180 lb 1.9 oz (81.7 kg) Height:     BEHAVIORAL SYMPTOMS/MOOD NEUROLOGICAL BOWEL NUTRITION STATUS      Continent Diet(Carb modified (ADA diet))  AMBULATORY STATUS COMMUNICATION OF NEEDS Skin   Extensive Assist Verbally Other (Comment)(LEE chronic venous changes: Need ongoing wound care at the facility.)                       Personal Care Assistance Level of Assistance  Bathing, Feeding, Dressing Bathing Assistance: Maximum assistance Feeding assistance: Independent Dressing Assistance: Maximum assistance     Functional Limitations Info  Sight, Hearing, Speech Sight Info: Adequate Hearing Info: Adequate/Impaired heavily Speech Info: Adequate    SPECIAL CARE FACTORS FREQUENCY                       Contractures Contractures Info: Not  present    Additional Factors Info  Code Status, Allergies Code Status Info: DNR Allergies Info: No known Allergies           Current Medications (02/02/2018):  This is the current hospital active medication list Current Facility-Administered Medications  Medication Dose Route Frequency Provider Last Rate Last Dose  . 0.9 %  sodium chloride infusion   Intravenous Continuous Shaune Pollack, MD 75 mL/hr at 02/02/18 0700    . acetaminophen (TYLENOL) tablet 650 mg  650 mg Oral Q6H PRN Shaune Pollack, MD   650 mg at 01/31/18 2254   Or  . acetaminophen (TYLENOL) suppository 650 mg  650 mg Rectal Q6H PRN Shaune Pollack, MD      . albuterol (PROVENTIL) (2.5 MG/3ML) 0.083% nebulizer solution 2.5 mg  2.5 mg Nebulization Q2H PRN Shaune Pollack, MD      . aspirin EC tablet 325 mg  325 mg Oral Daily Shaune Pollack, MD   325 mg at 02/02/18 8469  . bisacodyl (DULCOLAX) EC tablet 5 mg  5 mg Oral Daily PRN Shaune Pollack, MD      . heparin injection 5,000 Units  5,000 Units Subcutaneous Q8H Shaune Pollack, MD   5,000 Units at 02/02/18 [phone]  . HYDROcodone-acetaminophen (NORCO/VICODIN) 5-325 MG per tablet 1-2 tablet  1-2 tablet Oral Q4H PRN Shaune Pollack, MD   2 tablet at 02/01/18 1914  . insulin aspart (novoLOG) injection 0-5 Units  0-5 Units Subcutaneous QHS Shaune Pollack, MD      . insulin aspart (novoLOG)  injection 0-9 Units  0-9 Units Subcutaneous TID WC Shaune Pollack, MD   2 Units at 02/01/18 1743  . nitroGLYCERIN (NITROSTAT) SL tablet 0.4 mg  0.4 mg Sublingual Q5 min PRN Shaune Pollack, MD      . ondansetron [facility]) tablet 4 mg  4 mg Oral Q6H PRN Shaune Pollack, MD       Or  . ondansetron [facility]) injection 4 mg  4 mg Intravenous Q6H PRN Shaune Pollack, MD      . pravastatin (PRAVACHOL) tablet 40 mg  40 mg Oral QHS Shaune Pollack, MD   40 mg at 02/01/18 2032  . senna-docusate (Senokot-S) tablet 1 tablet  1 tablet Oral QHS PRN Shaune Pollack, MD      . sodium chloride (OCEAN) 0.65 % nasal spray 1 spray  1 spray Each Nare PRN Adrian Saran, MD   1 spray at  02/01/18 2032     Discharge Medications: Please see discharge summary for a list of discharge medications.  Relevant Imaging Results:  Relevant Lab Results:   Additional Information SS# [id number]  Judi Cong, LCSW

## 2018-02-02 NOTE — Progress Notes (Signed)
Report called to Talbert Forest, LPN at [facility],  [facility] called for transportation.

## 2018-02-02 NOTE — Clinical Social Work Note (Signed)
CSW received a call from the patient's son. The patient's son is aware of imminent discharge and is in agreement with the plan. CSW is signing off. Please consult should additional needs arise.  Argentina Ponder, MSW, Theresia Majors [phone]

## 2018-02-02 NOTE — Progress Notes (Signed)
Hypoglycemic Event  CBG: 68  Treatment: 15 GM carbohydrate snack  Symptoms: None  Follow-up CBG: Time:0906 CBG Result:119  Possible Reasons for Event: Unknown    Rigoberto Noel

## 2018-02-02 NOTE — Discharge Summary (Addendum)
[facility] [place] at [facility]   PATIENT NAME: Duane Turner    MR#:  [id number]  DATE OF BIRTH:  [birth year]  DATE OF ADMISSION:  01/31/2018 ADMITTING PHYSICIAN: Shaune Pollack, MD  DATE OF DISCHARGE: 02/02/2018  PRIMARY CARE PHYSICIAN: Lauro Regulus, MD    ADMISSION DIAGNOSIS:  Acute renal insufficiency [N28.9] Chest pain with moderate risk for cardiac etiology [R07.9]  DISCHARGE DIAGNOSIS:  Active Problems:   Hyperkalemia   SECONDARY DIAGNOSIS:   Past Medical History:  Diagnosis Date  . Anemia   . Arthritis   . BPH (benign prostatic hyperplasia)   . Chronic systolic heart failure (HCC)   . Diabetes mellitus without complication (HCC)   . Dysphagia   . Hyperaldosteronism (HCC)   . Hypertension   . Renal disorder     HOSPITAL COURSE:   84 year old male with history of chronic systolic heart failure, diabetes and chronic kidney disease stage III who presented from nursing home due to chest pain.  1.  Chest pain: Patient has ruled out for ACS. He was advised by cardiology.  Minimal elevation in troponin is due to demand ischemia and not ACS.   2.  Bradycardia: Bradycardia is mostly when patient is asleep.   There is no indication for pacemaker.   TSH was elevated however free T4 was normal   3.  Diabetes: She has low blood sugars and therefore all diabetic medications have been discontinued for now.  He will be discharged on sliding scale.  He needs close follow-up with his PCP.  Blood sugar should be monitored q. before meals and nightly.  4.  Essential hypertension: Blood pressure is low/normal and therefore lisinopril has been decreased to 10 mg daily.  5.  Chronic kidney disease stage III: Creatinine is at baseline  6. hyperkalemia: This has been resolved 7. LEE chronic venous changes: Need ongoing wound care at the facility.  Patient will benefit from outpatient palliative care consult.  DISCHARGE CONDITIONS AND DIET:    Stable for discharge on diabetic diet  CONSULTS OBTAINED:  Treatment Team:  Marykay Lex, MD  DRUG ALLERGIES:  No Known Allergies  DISCHARGE MEDICATIONS:   Allergies as of 02/02/2018   No Known Allergies     Medication List    STOP taking these medications   GLUCOTROL 10 MG tablet Generic drug:  glipiZIDE   metFORMIN 1000 MG tablet Commonly known as:  GLUCOPHAGE   sitaGLIPtin 100 MG tablet Commonly known as:  JANUVIA     TAKE these medications   aspirin EC 81 MG EC tablet Generic drug:  aspirin Take 81 mg by mouth daily.   insulin aspart 100 UNIT/ML injection Commonly known as:  novoLOG Inject 0-5 Units into the skin at bedtime. CBG < 70: implement hypoglycemia protocol  CBG 70 - 120: 0 units  CBG 121 - 150: 0 units  CBG 151 - 200: 0 units  CBG 201 - 250: 2 units  CBG 251 - 300: 3 units  CBG 301 - 350: 4 units  CBG 351 - 400: 5 units  CBG > 400 call MD   insulin aspart 100 UNIT/ML injection Commonly known as:  novoLOG Inject 0-9 Units into the skin 3 (three) times daily with meals. CBG < 70: implement hypoglycemia protocol  CBG 70 - 120: 0 units  CBG 121 - 150: 1 unit  CBG 151 - 200: 2 units  CBG 201 - 250: 3 units  CBG 251 - 300: 5 units  CBG  301 - 350: 7 units  CBG 351 - 400 9 units  CBG > 400 call MD   lisinopril 10 MG tablet Commonly known as:  PRINIVIL,ZESTRIL Take 1 tablet (10 mg total) by mouth at bedtime. For blood pressure What changed:    medication strength  how much to take   pravastatin 40 MG tablet Commonly known as:  PRAVACHOL Take 40 mg by mouth at bedtime.         Today   CHIEF COMPLAINT:  No acute events overnight   VITAL SIGNS:  Blood pressure 113/69, pulse (!) 47, temperature 98.6 F (37 C), resp. rate 18, weight 81.7 kg, SpO2 94 %.   REVIEW OF SYSTEMS:  Review of Systems  Unable to perform ROS: Other     PHYSICAL EXAMINATION:  GENERAL:  84 y.o.-year-old patient lying in the bed with no acute  distress.  NECK:  Supple, no jugular venous distention. No thyroid enlargement, no tenderness.  LUNGS: Normal breath sounds bilaterally, no wheezing, rales,rhonchi  No use of accessory muscles of respiration.  CARDIOVASCULAR: S1, S2 normal. No murmurs, rubs, or gallops.  ABDOMEN: Soft, non-tender, non-distended. Bowel sounds present. No organomegaly or mass.  EXTREMITIES: No pedal edema, cyanosis, or clubbing.  PSYCHIATRIC: The patient is alert and oriented x name Patient hard of hearing SKIN: No obvious rash, lesion, or ulcer.   DATA REVIEW:   CBC Recent Labs  Lab 02/01/18 0221  WBC 2.4*  HGB 9.9*  HCT 30.1*  PLT 127*    Chemistries  Recent Labs  Lab 02/02/18 0521  NA 133*  K 4.7  CL 103  CO2 24  GLUCOSE 82  BUN 81*  CREATININE 1.61*  CALCIUM 8.3*    Cardiac Enzymes Recent Labs  Lab 01/31/18 1732 02/01/18 0221  TROPONINI 0.04* 0.05*    Microbiology Results  @[email or web address] @  RADIOLOGY:  Dg Abdomen 1 View  Result Date: 01/31/2018 CLINICAL DATA:  Abdominal distention. EXAM: ABDOMEN - 1 VIEW COMPARISON:  None. FINDINGS: Mild colonic gaseous distension, with moderate stool burden, including fecal impaction. No visible obstruction or free air. Negative osseous structures. No abnormal calcifications. IMPRESSION: Mild colonic gaseous distention with moderate stool burden including fecal impaction. Correlate clinically for constipation. Electronically Signed   By: Elsie Stain M.D.   On: 01/31/2018 18:15   US Venous Img Lower Bilateral  Result Date: 02/01/2018 CLINICAL DATA:  Bilateral pain EXAM: BILATERAL LOWER EXTREMITY VENOUS DOPPLER ULTRASOUND TECHNIQUE: Gray-scale sonography with compression, as well as color and duplex ultrasound, were performed to evaluate the deep venous system from the level of the common femoral vein through the popliteal and proximal calf veins. COMPARISON:  None FINDINGS: Normal compressibility of the common femoral, superficial femoral, and  popliteal veins, as well as the proximal calf veins. No filling defects to suggest DVT on grayscale or color Doppler imaging. Doppler waveforms show normal direction of venous flow, normal respiratory phasicity and response to augmentation. Subcutaneous edema in bilateral calves. IMPRESSION: No evidence of  lower extremity deep vein thrombosis. Electronically Signed   By: Corlis Leak M.D.   On: 02/01/2018 09:24   Dg Chest Portable 1 View  Result Date: 01/31/2018 CLINICAL DATA:  Chest pain for 1 day. EXAM: PORTABLE CHEST 1 VIEW COMPARISON:  None. FINDINGS: Portable semierect radiograph. Cardiomegaly. Low lung volumes. Mild vascular congestion. No visible consolidation or edema. Osteopenia. IMPRESSION: Cardiomegaly.  No definite active infiltrates or failure. Electronically Signed   By: Elsie Stain M.D.   On: 01/31/2018 18:14  Allergies as of 02/02/2018   No Known Allergies     Medication List    STOP taking these medications   GLUCOTROL 10 MG tablet Generic drug:  glipiZIDE   metFORMIN 1000 MG tablet Commonly known as:  GLUCOPHAGE   sitaGLIPtin 100 MG tablet Commonly known as:  JANUVIA     TAKE these medications   aspirin EC 81 MG EC tablet Generic drug:  aspirin Take 81 mg by mouth daily.   insulin aspart 100 UNIT/ML injection Commonly known as:  novoLOG Inject 0-5 Units into the skin at bedtime. CBG < 70: implement hypoglycemia protocol  CBG 70 - 120: 0 units  CBG 121 - 150: 0 units  CBG 151 - 200: 0 units  CBG 201 - 250: 2 units  CBG 251 - 300: 3 units  CBG 301 - 350: 4 units  CBG 351 - 400: 5 units  CBG > 400 call MD   insulin aspart 100 UNIT/ML injection Commonly known as:  novoLOG Inject 0-9 Units into the skin 3 (three) times daily with meals. CBG < 70: implement hypoglycemia protocol  CBG 70 - 120: 0 units  CBG 121 - 150: 1 unit  CBG 151 - 200: 2 units  CBG 201 - 250: 3 units  CBG 251 - 300: 5 units  CBG 301 - 350: 7 units  CBG 351 - 400 9 units  CBG  > 400 call MD   lisinopril 10 MG tablet Commonly known as:  PRINIVIL,ZESTRIL Take 1 tablet (10 mg total) by mouth at bedtime. For blood pressure What changed:    medication strength  how much to take   pravastatin 40 MG tablet Commonly known as:  PRAVACHOL Take 40 mg by mouth at bedtime.          Stable for discharge   Patient should follow up with pcp  CODE STATUS:     Code Status Orders  (From admission, onward)         Start     Ordered   01/31/18 2023  Do not attempt resuscitation (DNR)  Continuous    Question Answer Comment  In the event of cardiac or respiratory ARREST Do not call a "code blue"   In the event of cardiac or respiratory ARREST Do not perform Intubation, CPR, defibrillation or ACLS   In the event of cardiac or respiratory ARREST Use medication by any route, position, wound care, and other measures to relive pain and suffering. May use oxygen, suction and manual treatment of airway obstruction as needed for comfort.      01/31/18 2022        Code Status History    This patient has a current code status but no historical code status.      TOTAL TIME TAKING CARE OF THIS PATIENT: 38 minutes.    Note: This dictation was prepared with Dragon dictation along with smaller phrase technology. Any transcriptional errors that result from this process are unintentional.  Lamica Mccart M.D on 02/02/2018 at 10:01 AM  Between 7am to 6pm - Pager - [phone] After 6pm go to [email or web address] - Social research officer, government  [facility] [place] [facility]  Office  [phone]  CC: Primary care physician; Lauro Regulus, MD

## 2018-02-02 NOTE — Clinical Social Work Note (Signed)
Clinical Social Work Assessment  Patient Details  Name: Duane Turner MRN: [id number] Date of Birth: Feb 24, 1933  Date of referral:  02/02/18               Reason for consult:  Facility Placement                Permission sought to share information with:  Facility Industrial/product designer granted to share information::  Yes, Verbal Permission Granted  Name::        Agency::  [facility]  Relationship::     Contact Information:     Housing/Transportation Living arrangements for the past 2 months:  Skilled Building surveyor of Information:  Medical Team, Adult Children, Facility, Siblings Patient Interpreter Needed:  None Criminal Activity/Legal Involvement Pertinent to Current Situation/Hospitalization:  No - Comment as needed Significant Relationships:  Adult Children, Merchandiser, retail, Siblings Lives with:  Facility Resident Do you feel safe going back to the place where you live?  Yes Need for family participation in patient care:  No (Coment)  Care giving concerns:  Patient admitted from a SNF  Social Worker assessment / plan:  The CSW attempted to meet with the patient and family at bedside. The patient was sleeping and no family was available. The CSW attempted to contact the patient's son, [name], by phone. The phone went straight to VM, and the CSW left a HIPPA compliant VM. The CSW then contacted the patient's sister (his listed emergency contact). Talbert Forest thanked the CSW for advising her of the admission and discharge, and she agreed with discharge back to his facility. The patient is a resident of [facility] for long term care. The CSW advised the patient's sister of his admitting diagnosis and current medical stability.  The CSW spoke with Tresa Endo, admissions coordinator for [facility], who agrees with discharge to the facility. The CSW has sent all documentation and has delivered the discharge packet, including  the DNR. The CSW is signing off. Please consult should needs arise.   Employment status:  Retired Database administrator PT Recommendations:  Not assessed at this time Information / Referral to community resources:     Patient/Family's Response to care:  The patient's family thanked the CSW.  Patient/Family's Understanding of and Emotional Response to Diagnosis, Current Treatment, and Prognosis:  The patient's family and facility are in agreement with the discharge plan.  Emotional Assessment Appearance:  Appears stated age Attitude/Demeanor/Rapport:  Gracious Affect (typically observed):  Pleasant Orientation:  Oriented to Self, Oriented to Place, Oriented to Situation Alcohol / Substance use:  Never Used Psych involvement (Current and /or in the community):  No (Comment)  Discharge Needs  Concerns to be addressed:  Care Coordination, Discharge Planning Concerns Readmission within the last 30 days:  No Current discharge risk:  Chronically ill Barriers to Discharge:  No Barriers Identified   Judi Cong, LCSW 02/02/2018, 11:09 AM

## 2018-02-03 LAB — T3, FREE: T3 FREE: 1.8 pg/mL — AB (ref 2.0–4.4)

## 2018-02-05 ENCOUNTER — Inpatient Hospital Stay: Payer: Self-pay

## 2018-02-05 ENCOUNTER — Other Ambulatory Visit: Payer: Self-pay

## 2018-02-05 ENCOUNTER — Emergency Department: Payer: Medicare Other

## 2018-02-05 ENCOUNTER — Inpatient Hospital Stay
Admission: EM | Admit: 2018-02-05 | Discharge: 2018-02-10 | DRG: 871 | Disposition: A | Discharge status: Hospice | Payer: Medicare Other | Attending: Internal Medicine | Admitting: Internal Medicine

## 2018-02-05 ENCOUNTER — Inpatient Hospital Stay: Payer: Medicare Other

## 2018-02-05 DIAGNOSIS — F0390 Unspecified dementia without behavioral disturbance: Secondary | ICD-10-CM | POA: Diagnosis present

## 2018-02-05 DIAGNOSIS — I69354 Hemiplegia and hemiparesis following cerebral infarction affecting left non-dominant side: Secondary | ICD-10-CM | POA: Diagnosis not present

## 2018-02-05 DIAGNOSIS — E269 Hyperaldosteronism, unspecified: Secondary | ICD-10-CM | POA: Diagnosis present

## 2018-02-05 DIAGNOSIS — J9621 Acute and chronic respiratory failure with hypoxia: Secondary | ICD-10-CM | POA: Diagnosis present

## 2018-02-05 DIAGNOSIS — Z7982 Long term (current) use of aspirin: Secondary | ICD-10-CM

## 2018-02-05 DIAGNOSIS — R063 Periodic breathing: Secondary | ICD-10-CM | POA: Diagnosis not present

## 2018-02-05 DIAGNOSIS — J9622 Acute and chronic respiratory failure with hypercapnia: Secondary | ICD-10-CM | POA: Diagnosis present

## 2018-02-05 DIAGNOSIS — I110 Hypertensive heart disease with heart failure: Secondary | ICD-10-CM | POA: Diagnosis present

## 2018-02-05 DIAGNOSIS — J189 Pneumonia, unspecified organism: Secondary | ICD-10-CM | POA: Diagnosis present

## 2018-02-05 DIAGNOSIS — N400 Enlarged prostate without lower urinary tract symptoms: Secondary | ICD-10-CM | POA: Diagnosis present

## 2018-02-05 DIAGNOSIS — G9340 Encephalopathy, unspecified: Secondary | ICD-10-CM | POA: Diagnosis not present

## 2018-02-05 DIAGNOSIS — I440 Atrioventricular block, first degree: Secondary | ICD-10-CM | POA: Diagnosis present

## 2018-02-05 DIAGNOSIS — I255 Ischemic cardiomyopathy: Secondary | ICD-10-CM | POA: Diagnosis present

## 2018-02-05 DIAGNOSIS — E871 Hypo-osmolality and hyponatremia: Secondary | ICD-10-CM | POA: Diagnosis present

## 2018-02-05 DIAGNOSIS — E785 Hyperlipidemia, unspecified: Secondary | ICD-10-CM | POA: Diagnosis present

## 2018-02-05 DIAGNOSIS — R6521 Severe sepsis with septic shock: Secondary | ICD-10-CM | POA: Diagnosis not present

## 2018-02-05 DIAGNOSIS — J9601 Acute respiratory failure with hypoxia: Secondary | ICD-10-CM | POA: Diagnosis not present

## 2018-02-05 DIAGNOSIS — G92 Toxic encephalopathy: Secondary | ICD-10-CM | POA: Diagnosis present

## 2018-02-05 DIAGNOSIS — N179 Acute kidney failure, unspecified: Secondary | ICD-10-CM | POA: Diagnosis not present

## 2018-02-05 DIAGNOSIS — I5023 Acute on chronic systolic (congestive) heart failure: Secondary | ICD-10-CM | POA: Diagnosis present

## 2018-02-05 DIAGNOSIS — R680 Hypothermia, not associated with low environmental temperature: Secondary | ICD-10-CM | POA: Diagnosis present

## 2018-02-05 DIAGNOSIS — E875 Hyperkalemia: Secondary | ICD-10-CM | POA: Diagnosis present

## 2018-02-05 DIAGNOSIS — E119 Type 2 diabetes mellitus without complications: Secondary | ICD-10-CM | POA: Diagnosis present

## 2018-02-05 DIAGNOSIS — Z66 Do not resuscitate: Secondary | ICD-10-CM | POA: Diagnosis present

## 2018-02-05 DIAGNOSIS — I509 Heart failure, unspecified: Secondary | ICD-10-CM

## 2018-02-05 DIAGNOSIS — Z6826 Body mass index (BMI) 26.0-26.9, adult: Secondary | ICD-10-CM

## 2018-02-05 DIAGNOSIS — J9600 Acute respiratory failure, unspecified whether with hypoxia or hypercapnia: Secondary | ICD-10-CM

## 2018-02-05 DIAGNOSIS — D649 Anemia, unspecified: Secondary | ICD-10-CM | POA: Diagnosis present

## 2018-02-05 DIAGNOSIS — I081 Rheumatic disorders of both mitral and tricuspid valves: Secondary | ICD-10-CM | POA: Diagnosis present

## 2018-02-05 DIAGNOSIS — A419 Sepsis, unspecified organism: Secondary | ICD-10-CM | POA: Diagnosis present

## 2018-02-05 DIAGNOSIS — E669 Obesity, unspecified: Secondary | ICD-10-CM | POA: Diagnosis present

## 2018-02-05 DIAGNOSIS — J9602 Acute respiratory failure with hypercapnia: Secondary | ICD-10-CM

## 2018-02-05 DIAGNOSIS — Z515 Encounter for palliative care: Secondary | ICD-10-CM | POA: Diagnosis present

## 2018-02-05 DIAGNOSIS — R140 Abdominal distension (gaseous): Secondary | ICD-10-CM

## 2018-02-05 DIAGNOSIS — Z79899 Other long term (current) drug therapy: Secondary | ICD-10-CM

## 2018-02-05 DIAGNOSIS — Y95 Nosocomial condition: Secondary | ICD-10-CM | POA: Diagnosis present

## 2018-02-05 DIAGNOSIS — R0602 Shortness of breath: Secondary | ICD-10-CM

## 2018-02-05 DIAGNOSIS — E872 Acidosis: Secondary | ICD-10-CM | POA: Diagnosis present

## 2018-02-05 DIAGNOSIS — D696 Thrombocytopenia, unspecified: Secondary | ICD-10-CM | POA: Diagnosis present

## 2018-02-05 DIAGNOSIS — R001 Bradycardia, unspecified: Secondary | ICD-10-CM | POA: Diagnosis present

## 2018-02-05 DIAGNOSIS — Z794 Long term (current) use of insulin: Secondary | ICD-10-CM

## 2018-02-05 LAB — TROPONIN I
TROPONIN I: 0.04 ng/mL — AB (ref <0.03)
Troponin I: 0.03 ng/mL (ref <0.03)
Troponin I: 0.04 ng/mL (ref <0.03)

## 2018-02-05 LAB — BLOOD GAS, ARTERIAL
ACID-BASE DEFICIT: 6.3 mmol/L — AB (ref 0.0–2.0)
Acid-base deficit: 3 mmol/L — ABNORMAL HIGH (ref 0.0–2.0)
BICARBONATE: 21.9 mmol/L (ref 20.0–28.0)
Bicarbonate: 22.7 mmol/L (ref 20.0–28.0)
DELIVERY SYSTEMS: POSITIVE
Delivery systems: POSITIVE
Expiratory PAP: 5
Expiratory PAP: 5
FIO2: 0.4
FIO2: 0.4
INSPIRATORY PAP: 12
Inspiratory PAP: 12
Mechanical Rate: 8
O2 Saturation: 84.5 %
O2 Saturation: 97.8 %
PATIENT TEMPERATURE: 37
Patient temperature: 37
pCO2 arterial: 56 mmHg — ABNORMAL HIGH (ref 32.0–48.0)
pCO2 arterial: 42 mmHg (ref 32.0–48.0)
pH, Arterial: 7.2 — ABNORMAL LOW (ref 7.350–7.450)
pH, Arterial: 7.34 — ABNORMAL LOW (ref 7.350–7.450)
pO2, Arterial: 61 mmHg — ABNORMAL LOW (ref 83.0–108.0)
pO2, Arterial: 107 mmHg (ref 83.0–108.0)

## 2018-02-05 LAB — COMPREHENSIVE METABOLIC PANEL
ALT: 51 U/L — ABNORMAL HIGH (ref 0–44)
ANION GAP: 8 (ref 5–15)
AST: 32 U/L (ref 15–41)
Albumin: 3.2 g/dL — ABNORMAL LOW (ref 3.5–5.0)
Alkaline Phosphatase: 95 U/L (ref 38–126)
BUN: 74 mg/dL — ABNORMAL HIGH (ref 8–23)
CHLORIDE: 102 mmol/L (ref 98–111)
CO2: 22 mmol/L (ref 22–32)
Calcium: 8.4 mg/dL — ABNORMAL LOW (ref 8.9–10.3)
Creatinine, Ser: 1.51 mg/dL — ABNORMAL HIGH (ref 0.61–1.24)
GFR calc non Af Amer: 41 mL/min — ABNORMAL LOW (ref >60)
GFR, EST AFRICAN AMERICAN: 48 mL/min — AB (ref >60)
GLUCOSE: 149 mg/dL — AB (ref 70–99)
Potassium: 4.4 mmol/L (ref 3.5–5.1)
Sodium: 132 mmol/L — ABNORMAL LOW (ref 135–145)
Total Bilirubin: 0.7 mg/dL (ref 0.3–1.2)
Total Protein: 6.9 g/dL (ref 6.5–8.1)

## 2018-02-05 LAB — MAGNESIUM: Magnesium: 2.7 mg/dL — ABNORMAL HIGH (ref 1.7–2.4)

## 2018-02-05 LAB — URINALYSIS, COMPLETE (UACMP) WITH MICROSCOPIC
Bilirubin Urine: NEGATIVE
Glucose, UA: NEGATIVE mg/dL
Hgb urine dipstick: NEGATIVE
Ketones, ur: NEGATIVE mg/dL
Nitrite: NEGATIVE
PROTEIN: NEGATIVE mg/dL
Specific Gravity, Urine: 1.008 (ref 1.005–1.030)
pH: 5 (ref 5.0–8.0)

## 2018-02-05 LAB — BRAIN NATRIURETIC PEPTIDE: B NATRIURETIC PEPTIDE 5: 589 pg/mL — AB (ref 0.0–100.0)

## 2018-02-05 LAB — CBC
HCT: 33 % — ABNORMAL LOW (ref 39.0–52.0)
Hemoglobin: 10.6 g/dL — ABNORMAL LOW (ref 13.0–17.0)
MCH: 29.4 pg (ref 26.0–34.0)
MCHC: 32.1 g/dL (ref 30.0–36.0)
MCV: 91.7 fL (ref 80.0–100.0)
PLATELETS: 105 10*3/uL — AB (ref 150–400)
RBC: 3.6 MIL/uL — ABNORMAL LOW (ref 4.22–5.81)
RDW: 15.5 % (ref 11.5–15.5)
WBC: 2.9 10*3/uL — ABNORMAL LOW (ref 4.0–10.5)
nRBC: 1.4 % — ABNORMAL HIGH (ref 0.0–0.2)

## 2018-02-05 LAB — PROCALCITONIN: Procalcitonin: 4.51 ng/mL

## 2018-02-05 LAB — LACTIC ACID, PLASMA: Lactic Acid, Venous: 1.4 mmol/L (ref 0.5–1.9)

## 2018-02-05 LAB — TSH: TSH: 3.008 u[IU]/mL (ref 0.350–4.500)

## 2018-02-05 LAB — GLUCOSE, CAPILLARY: Glucose-Capillary: 126 mg/dL — ABNORMAL HIGH (ref 70–99)

## 2018-02-05 MED ORDER — ONDANSETRON HCL 4 MG/2ML IJ SOLN: 4.0000 mg | Freq: Four times a day (QID) | INTRAMUSCULAR | Status: DC | PRN

## 2018-02-05 MED ORDER — SENNOSIDES-DOCUSATE SODIUM 8.6-50 MG PO TABS: 1.0000 | ORAL_TABLET | Freq: Every evening | ORAL | Status: DC | PRN

## 2018-02-05 MED ORDER — SODIUM CHLORIDE 0.9 % IV SOLN
250.0000 mL | INTRAVENOUS | Status: DC | PRN
  Administered 2018-02-05 – 2018-02-07 (×2): 250 mL via INTRAVENOUS

## 2018-02-05 MED ORDER — SODIUM CHLORIDE 0.9% FLUSH
3.0000 mL | Freq: Two times a day (BID) | INTRAVENOUS | Status: DC
  Administered 2018-02-05 – 2018-02-08 (×6): 3 mL via INTRAVENOUS

## 2018-02-05 MED ORDER — FUROSEMIDE 10 MG/ML IJ SOLN
60.0000 mg | INTRAMUSCULAR | Status: AC
  Administered 2018-02-05: 60 mg via INTRAVENOUS
  Filled 2018-02-05: qty 6

## 2018-02-05 MED ORDER — ORAL CARE MOUTH RINSE
15.0000 mL | Freq: Two times a day (BID) | OROMUCOSAL | Status: DC
  Administered 2018-02-06 – 2018-02-08 (×6): 15 mL via OROMUCOSAL

## 2018-02-05 MED ORDER — ACETAMINOPHEN 325 MG PO TABS: 650.0000 mg | ORAL_TABLET | Freq: Four times a day (QID) | ORAL | Status: DC | PRN

## 2018-02-05 MED ORDER — SODIUM CHLORIDE 0.9 % IV SOLN
1.0000 g | Freq: Once | INTRAVENOUS | Status: AC
  Administered 2018-02-05: 1 g via INTRAVENOUS
  Filled 2018-02-05: qty 1

## 2018-02-05 MED ORDER — METHYLPREDNISOLONE SODIUM SUCC 125 MG IJ SOLR
60.0000 mg | INTRAMUSCULAR | Status: AC
  Administered 2018-02-05: 60 mg via INTRAVENOUS
  Filled 2018-02-05: qty 2

## 2018-02-05 MED ORDER — IPRATROPIUM-ALBUTEROL 0.5-2.5 (3) MG/3ML IN SOLN
3.0000 mL | Freq: Four times a day (QID) | RESPIRATORY_TRACT | Status: DC
  Administered 2018-02-05 – 2018-02-09 (×15): 3 mL via RESPIRATORY_TRACT
  Filled 2018-02-05 (×15): qty 3

## 2018-02-05 MED ORDER — SODIUM CHLORIDE 0.9% FLUSH
3.0000 mL | INTRAVENOUS | Status: DC | PRN
  Administered 2018-02-08: 3 mL via INTRAVENOUS
  Filled 2018-02-05: qty 3

## 2018-02-05 MED ORDER — ASPIRIN EC 81 MG PO TBEC: 81.0000 mg | DELAYED_RELEASE_TABLET | Freq: Every day | ORAL | Status: DC

## 2018-02-05 MED ORDER — IPRATROPIUM-ALBUTEROL 0.5-2.5 (3) MG/3ML IN SOLN
3.0000 mL | Freq: Four times a day (QID) | RESPIRATORY_TRACT | Status: DC | PRN
  Filled 2018-02-05: qty 3

## 2018-02-05 MED ORDER — MORPHINE SULFATE (PF) 2 MG/ML IV SOLN
2.0000 mg | Freq: Once | INTRAVENOUS | Status: AC
  Administered 2018-02-05: 2 mg via INTRAVENOUS
  Filled 2018-02-05: qty 1

## 2018-02-05 MED ORDER — FUROSEMIDE 10 MG/ML IJ SOLN
INTRAMUSCULAR | Status: AC
  Filled 2018-02-05: qty 4

## 2018-02-05 MED ORDER — HEPARIN SODIUM (PORCINE) 5000 UNIT/ML IJ SOLN
5000.0000 [IU] | Freq: Three times a day (TID) | INTRAMUSCULAR | Status: DC
  Administered 2018-02-05 – 2018-02-09 (×11): 5000 [IU] via SUBCUTANEOUS
  Filled 2018-02-05 (×11): qty 1

## 2018-02-05 MED ORDER — ONDANSETRON HCL 4 MG PO TABS: 4.0000 mg | ORAL_TABLET | Freq: Four times a day (QID) | ORAL | Status: DC | PRN

## 2018-02-05 MED ORDER — DOPAMINE-DEXTROSE 3.2-5 MG/ML-% IV SOLN
INTRAVENOUS | Status: AC
  Filled 2018-02-05: qty 250

## 2018-02-05 MED ORDER — VANCOMYCIN HCL IN DEXTROSE 1-5 GM/200ML-% IV SOLN
1000.0000 mg | Freq: Once | INTRAVENOUS | Status: AC
  Administered 2018-02-05: 1000 mg via INTRAVENOUS
  Filled 2018-02-05: qty 200

## 2018-02-05 MED ORDER — SODIUM CHLORIDE 0.9% FLUSH
10.0000 mL | Freq: Two times a day (BID) | INTRAVENOUS | Status: DC
  Administered 2018-02-05 – 2018-02-08 (×5): 10 mL

## 2018-02-05 MED ORDER — SODIUM CHLORIDE 0.9 % IV SOLN
2.0000 g | Freq: Two times a day (BID) | INTRAVENOUS | Status: DC
  Administered 2018-02-05: 2 g via INTRAVENOUS
  Filled 2018-02-05 (×3): qty 2

## 2018-02-05 MED ORDER — IPRATROPIUM-ALBUTEROL 0.5-2.5 (3) MG/3ML IN SOLN
3.0000 mL | RESPIRATORY_TRACT | Status: DC | PRN
  Filled 2018-02-05: qty 3

## 2018-02-05 MED ORDER — HYDROCERIN EX CREA
1.0000 "application " | TOPICAL_CREAM | Freq: Every day | CUTANEOUS | Status: DC | PRN
  Filled 2018-02-05: qty 113

## 2018-02-05 MED ORDER — METHYLPREDNISOLONE SODIUM SUCC 125 MG IJ SOLR: 60.0000 mg | Freq: Once | INTRAMUSCULAR | Status: DC

## 2018-02-05 MED ORDER — FUROSEMIDE 10 MG/ML IJ SOLN: 60.0000 mg | Freq: Once | INTRAMUSCULAR | Status: DC

## 2018-02-05 MED ORDER — SODIUM CHLORIDE 0.9% FLUSH: 10.0000 mL | INTRAVENOUS | Status: DC | PRN

## 2018-02-05 MED ORDER — FAMOTIDINE IN NACL 20-0.9 MG/50ML-% IV SOLN
20.0000 mg | INTRAVENOUS | Status: DC
  Administered 2018-02-05 – 2018-02-06 (×2): 20 mg via INTRAVENOUS
  Filled 2018-02-05 (×2): qty 50

## 2018-02-05 MED ORDER — FUROSEMIDE 10 MG/ML IJ SOLN
40.0000 mg | Freq: Once | INTRAMUSCULAR | Status: AC
  Administered 2018-02-05: 40 mg via INTRAVENOUS

## 2018-02-05 MED ORDER — ATROPINE SULFATE 1 MG/10ML IJ SOSY
PREFILLED_SYRINGE | INTRAMUSCULAR | Status: AC
  Administered 2018-02-05: 13:00:00
  Filled 2018-02-05: qty 10

## 2018-02-05 MED ORDER — POTASSIUM CHLORIDE CRYS ER 20 MEQ PO TBCR: 40.0000 meq | EXTENDED_RELEASE_TABLET | Freq: Once | ORAL | Status: DC

## 2018-02-05 MED ORDER — CHLORHEXIDINE GLUCONATE 0.12 % MT SOLN
15.0000 mL | Freq: Two times a day (BID) | OROMUCOSAL | Status: DC
  Administered 2018-02-06 – 2018-02-09 (×7): 15 mL via OROMUCOSAL
  Filled 2018-02-05 (×5): qty 15

## 2018-02-05 MED ORDER — VANCOMYCIN HCL 10 G IV SOLR
1250.0000 mg | INTRAVENOUS | Status: DC
  Filled 2018-02-05: qty 1250

## 2018-02-05 MED ORDER — MIDODRINE HCL 5 MG PO TABS
10.0000 mg | ORAL_TABLET | Freq: Three times a day (TID) | ORAL | Status: DC
  Filled 2018-02-05: qty 2

## 2018-02-05 MED ORDER — DOPAMINE-DEXTROSE 3.2-5 MG/ML-% IV SOLN
5.0000 ug/kg/min | Freq: Once | INTRAVENOUS | Status: AC
  Administered 2018-02-05: 5 ug/kg/min via INTRAVENOUS

## 2018-02-05 MED ORDER — PRAVASTATIN SODIUM 20 MG PO TABS
40.0000 mg | ORAL_TABLET | Freq: Every day | ORAL | Status: DC
  Filled 2018-02-05 (×4): qty 1

## 2018-02-05 MED ORDER — LORAZEPAM 2 MG/ML IJ SOLN
1.0000 mg | INTRAMUSCULAR | Status: DC | PRN
  Administered 2018-02-05 – 2018-02-06 (×2): 1 mg via INTRAVENOUS
  Filled 2018-02-05 (×2): qty 1

## 2018-02-05 MED ORDER — SODIUM CHLORIDE 0.9 % IV BOLUS
500.0000 mL | Freq: Once | INTRAVENOUS | Status: AC
  Administered 2018-02-05: 500 mL via INTRAVENOUS

## 2018-02-05 MED ORDER — ACETAMINOPHEN 650 MG RE SUPP: 650.0000 mg | Freq: Four times a day (QID) | RECTAL | Status: DC | PRN

## 2018-02-05 MED ORDER — ATROPINE SULFATE 1 MG/ML IJ SOLN
1.0000 mg | Freq: Once | INTRAMUSCULAR | Status: AC
  Administered 2018-02-05: 1 mg via INTRAVENOUS

## 2018-02-05 MED ORDER — BUDESONIDE 0.5 MG/2ML IN SUSP
0.5000 mg | Freq: Two times a day (BID) | RESPIRATORY_TRACT | Status: DC
  Administered 2018-02-05 – 2018-02-08 (×7): 0.5 mg via RESPIRATORY_TRACT
  Filled 2018-02-05 (×8): qty 2

## 2018-02-05 MED ORDER — DOPAMINE-DEXTROSE 3.2-5 MG/ML-% IV SOLN: 5.0000 ug/kg/min | Freq: Once | INTRAVENOUS | Status: DC

## 2018-02-05 MED ORDER — DOBUTAMINE IN D5W 4-5 MG/ML-% IV SOLN
2.5000 ug/kg/min | INTRAVENOUS | Status: DC
  Administered 2018-02-05: 2.5 ug/kg/min via INTRAVENOUS
  Filled 2018-02-05 (×2): qty 250

## 2018-02-05 MED ORDER — BISACODYL 10 MG RE SUPP
10.0000 mg | Freq: Once | RECTAL | Status: AC
  Administered 2018-02-05: 10 mg via RECTAL
  Filled 2018-02-05: qty 1

## 2018-02-05 NOTE — Progress Notes (Signed)
CODE SEPSIS - PHARMACY COMMUNICATION  **Broad Spectrum Antibiotics should be administered within 1 hour of Sepsis diagnosis**  Time Code Sepsis Called/Page Received: 13:15  Antibiotics Ordered: Vancomycin and Cefepime  Time of 1st antibiotic administration: 15:50 Cefepime given  Additional action taken by pharmacy: n/a  If necessary, Name of Provider/Nurse Contacted: n/a    Foye Deer ,PharmD Clinical Pharmacist  02/05/2018  2:06 PM

## 2018-02-05 NOTE — H&P (Addendum)
[facility] - [place] at [facility]   PATIENT NAME: Duane Turner    MR#:  [id number]  DATE OF BIRTH:  30-Aug-1932  DATE OF ADMISSION:  02/05/2018  PRIMARY CARE PHYSICIAN: Lauro Regulus, MD   REQUESTING/REFERRING PHYSICIAN:   CHIEF COMPLAINT:   Chief Complaint  Patient presents with  . Respiratory Distress    HISTORY OF PRESENT ILLNESS: Rayner Erman  is a 84 y.o. male with a known history of chronic systolic heart failure, diabetes mellitus type 2, hyperaldosteronism, hypertension, prostate hypertrophy, bradycardia was recently discharged from our hospital on 02/02/2018.  Patient is a resident of [facility] [facility].  He was dizzy and short of breath.  EKG in the facility showed a junctional rhythm with heart rate of 45 bpm.  He was brought to the emergency room oxygen saturation was low and was put on BiPAP and stabilized.  The heart rate was around 40 bpm and he  received atropine by ER physician.  Heart rate improved after he was given atropine.  Patient was evaluated with chest x-ray which showed multifocal pneumonia and effusion.  Was started on broad-spectrum IV antibiotics.  Core body temperature was 88 F patient was hypothermic and he was put on warming blanket.  Because of the low blood pressure patient also ordered 500 mL normal saline bolus.  Patient is awake and responds to verbal commands but not completely oriented.  He has generalized edema.  Nurse practitioner from [facility] is at bedside.  She discussed patient's condition with patient's son who is a power of attorney [name] who does not want any cardiac resuscitation, CPR but wants only intubation and ventilator for respiratory failure.  According to the information from nurse practitioner in the last 2 weeks patient gained 40 pounds.  PAST MEDICAL HISTORY:   Past Medical History:  Diagnosis Date  . Anemia   . Arthritis   . BPH (benign prostatic hyperplasia)    . Chronic systolic heart failure (HCC)   . Diabetes mellitus without complication (HCC)   . Dysphagia   . Hyperaldosteronism (HCC)   . Hypertension   . Renal disorder     PAST SURGICAL HISTORY: History reviewed. No pertinent surgical history.  SOCIAL HISTORY:  Social History   Tobacco Use  . Smoking status: Never Smoker  . Smokeless tobacco: Never Used  Substance Use Topics  . Alcohol use: Not Currently    FAMILY HISTORY: No family history on file.  DRUG ALLERGIES: No Known Allergies  REVIEW OF SYSTEMS:   Could not be obtained as patient is lethargic with decreased responsiveness MEDICATIONS AT HOME:  Prior to Admission medications   Medication Sig Start Date End Date Taking? Authorizing Provider  acetaminophen (TYLENOL) 325 MG tablet Take 650 mg by mouth every 4 (four) hours as needed for mild pain or moderate pain.   Yes [provider]  aspirin (ASPIRIN EC) 81 MG EC tablet Take 81 mg by mouth daily.     Yes [provider]  insulin NPH-regular Human (NOVOLIN 70/30) (70-30) 100 UNIT/ML injection Inject 10 Units into the skin daily.   Yes [provider]  magnesium oxide (MAG-OX) 400 MG tablet Take 400 mg by mouth daily.   Yes [provider]  midodrine (PROAMATINE) 10 MG tablet Take 10 mg by mouth 3 (three) times daily with meals.   Yes [provider]  multivitamin (ONE-A-DAY MEN'S) TABS tablet Take 1 tablet by mouth daily.   Yes [provider]  potassium chloride (K-DUR,KLOR-CON) 10 MEQ tablet Take 20 mEq by mouth daily.   Yes [provider]  pravastatin (PRAVACHOL) 40 MG tablet Take 40 mg by mouth at bedtime.     Yes [provider]  Skin Protectants, Misc. (EUCERIN) cream Apply 1 application topically daily as needed for dry skin. Apply to bilateral lower extremities   Yes [provider]  torsemide (DEMADEX) 20 MG tablet Take 40 mg by mouth 2 (two) times daily.   Yes [provider]  Vitamin D, Ergocalciferol, (DRISDOL) 50000 units CAPS capsule Take 50,000 Units by mouth every 7 (seven) days.   Yes [provider]  insulin aspart (NOVOLOG) 100 UNIT/ML injection Inject 0-5 Units into the skin at bedtime. CBG < 70: implement hypoglycemia protocol  CBG 70 - 120: 0 units  CBG 121 - 150: 0 units  CBG 151 - 200: 0 units  CBG 201 - 250: 2 units  CBG 251 - 300: 3 units  CBG 301 - 350: 4 units  CBG 351 - 400: 5 units  CBG > 400 call MD Patient not taking: Reported on 02/05/2018 02/02/18   Adrian Saran, MD  insulin aspart (NOVOLOG) 100 UNIT/ML injection Inject 0-9 Units into the skin 3 (three) times daily with meals. CBG < 70: implement hypoglycemia protocol  CBG 70 - 120: 0 units  CBG 121 - 150: 1 unit  CBG 151 - 200: 2 units  CBG 201 - 250: 3 units  CBG 251 - 300: 5 units  CBG 301 - 350: 7 units  CBG 351 - 400 9 units  CBG > 400 call MD Patient not taking: Reported on 02/05/2018 02/02/18   Adrian Saran, MD  lisinopril (PRINIVIL,ZESTRIL) 10 MG tablet Take 1 tablet (10 mg total) by mouth at bedtime. For blood pressure Patient not taking: Reported on 02/05/2018 02/02/18   Adrian Saran, MD      PHYSICAL EXAMINATION:   VITAL SIGNS: Blood pressure 101/74, pulse 66, temperature (!) 88.5 F (31.4 C), temperature source Rectal, resp. rate (!) 21, height 5\' 8"  (1.727 m), weight 81.7 kg, SpO2 98 %.  GENERAL:  84 y.o.-year-old patient lying in the bed on BiPAP for respiratory distress IPAP 12 EPAP 8 Rate 8 FiO2 40% EYES: Pupils equal, round, reactive to light and accommodation. No scleral icterus. Extraocular muscles intact.  HEENT: Head atraumatic, normocephalic. Oropharynx and nasopharynx clear.  NECK:  Supple, no jugular venous distention. No thyroid enlargement, no tenderness.  LUNGS: Decreased breath sounds bilaterally, bilateral rales heard. No use of accessory muscles of respiration.  CARDIOVASCULAR: S1, S2 normal. No murmurs, rubs, or gallops.   ABDOMEN: Soft, nontender, nondistended. Bowel sounds present. No organomegaly or mass.  EXTREMITIES: Bilateral lower extremity 3+ edema Upper extremity edema also noted NEUROLOGIC: Awake and responds to verbal commands On BiPAP for respiratory distress PSYCHIATRIC: Not oriented to time place and person SKIN: No obvious rash, lesion, or ulcer.   LABORATORY PANEL:   CBC Recent Labs  Lab 01/31/18 1732 02/01/18 0221 02/05/18 1222  WBC 2.5* 2.4* 2.9*  HGB 10.2* 9.9* 10.6*  HCT 30.6* 30.1* 33.0*  PLT 137* 127* 105*  MCV 91.1 90.4 91.7  MCH 30.4 29.7 29.4  MCHC 33.3 32.9 32.1  RDW 15.2 15.0 15.5   ------------------------------------------------------------------------------------------------------------------  Chemistries  Recent Labs  Lab 01/31/18 1732 02/01/18 0221 02/02/18 0521 02/05/18 1222  NA 132* 135 133* 132*  K 5.9* 4.5 4.7 4.4  CL 102 104 103 102  CO2 24 26 24  22  GLUCOSE 243* 58* 82 149*  BUN 76* 77* 81* 74*  CREATININE 1.45* 1.57* 1.61* 1.51*  CALCIUM 8.5* 8.7* 8.3* 8.4*  MG  --   --   --  2.7*  AST  --   --   --  32  ALT  --   --   --  51*  ALKPHOS  --   --   --  95  BILITOT  --   --   --  0.7   ------------------------------------------------------------------------------------------------------------------ estimated creatinine clearance is 36.5 mL/min (A) (by C-G formula based on SCr of 1.51 mg/dL (H)). ------------------------------------------------------------------------------------------------------------------ No results for input(s): TSH, T4TOTAL, T3FREE, THYROIDAB in the last 72 hours.  Invalid input(s): FREET3   Coagulation profile No results for input(s): INR, PROTIME in the last 168 hours. ------------------------------------------------------------------------------------------------------------------- No results for input(s): DDIMER in the last 72  hours. -------------------------------------------------------------------------------------------------------------------  Cardiac Enzymes Recent Labs  Lab 01/31/18 1732 02/01/18 0221 02/05/18 1222  TROPONINI 0.04* 0.05* 0.03*   ------------------------------------------------------------------------------------------------------------------ Invalid input(s): POCBNP  ---------------------------------------------------------------------------------------------------------------  Urinalysis    Component Value Date/Time   COLORURINE YELLOW 10/14/2006 0303   APPEARANCEUR CLEAR 10/14/2006 0303   LABSPEC 1.033 (H) 10/14/2006 0303   PHURINE 5.0 10/14/2006 0303   GLUCOSEU >1000 (A) 10/14/2006 0303   HGBUR NEGATIVE 10/14/2006 0303   BILIRUBINUR NEGATIVE 10/14/2006 0303   KETONESUR 15 (A) 10/14/2006 0303   PROTEINUR NEGATIVE 10/14/2006 0303   UROBILINOGEN 0.2 10/14/2006 0303   NITRITE NEGATIVE 10/14/2006 0303   LEUKOCYTESUR NEGATIVE 10/14/2006 0303     RADIOLOGY: Dg Chest Portable 1 View  Result Date: 02/05/2018 CLINICAL DATA:  Short of breath, respiratory distress EXAM: PORTABLE CHEST 1 VIEW COMPARISON:  Portable chest x-ray of 01/31/2017 FINDINGS: Moderate cardiomegaly again is noted. There is patchy airspace disease bilaterally right greater than left most consistent with multifocal pneumonia. Small left effusion cannot be excluded. No bony abnormality is seen. IMPRESSION: 1. New patchy airspace disease right greater than left most consistent with multifocal pneumonia. 2. Cardiomegaly.  Possible small effusions. Electronically Signed   By: Dwyane Dee M.D.   On: 02/05/2018 13:10    EKG: Orders placed or performed during the hospital encounter of 02/05/18  . EKG 12-Lead  . EKG 12-Lead    IMPRESSION AND PLAN: 84 year old male with a known history of chronic systolic heart failure, diabetes mellitus type 2, hyperaldosteronism, hypertension, prostate hypertrophy, bradycardia was  referred from [facility] facility for lethargy, respiratory distress and bradycardia  -Acute hypoxic respiratory failure Continue BiPAP for respiratory distress Admit patient to stepdown unit Consult intensivist  -Sepsis with hypothermia Warming blanket With broad-spectrum antibiotics and cultures  -Healthcare associated pneumonia Start IV vancomycin and IV cefepime antibiotics  -Decompensated chronic systolic heart failure Cannot give diuretics in view of low blood pressure Cardiology consultation  -Bradycardia Cardiology follow-up  -Type 2 diabetes mellitus Currently patient n.p.o. Sliding scale coverage with insulin   All the records are reviewed and case discussed with ED provider. Management plans discussed with the patient, family and they are in agreement.  CODE STATUS:Partial code    Code Status Orders  (From admission, onward)         Start     Ordered   02/05/18 1348  Limited resuscitation (code)  Continuous    Question Answer Comment  In the event of cardiac or respiratory ARREST: Initiate Code Blue, Call Rapid Response Yes   In the event of cardiac or respiratory ARREST: Perform CPR No   In the event of cardiac or  respiratory ARREST: Perform Intubation/Mechanical Ventilation Yes   In the event of cardiac or respiratory ARREST: Use NIPPV/BiPAp only if indicated Yes   In the event of cardiac or respiratory ARREST: Administer ACLS medications if indicated No   In the event of cardiac or respiratory ARREST: Perform Defibrillation or Cardioversion if indicated No      02/05/18 1347        Code Status History    Date Active Date Inactive Code Status Order ID Comments User Context   02/05/2018 1346 02/05/2018 1347 DNR [id number]  Ihor Austin, MD ED   01/31/2018 2022 02/02/2018 1712 DNR [id number]  Shaune Pollack, MD Inpatient    Advance Directive Documentation     Most Recent Value  Type of Advance Directive  Out of facility DNR (pink MOST or  yellow form)  Pre-existing out of facility DNR order (yellow form or pink MOST form)  -  "MOST" Form in Place?  -       TOTAL CRITICAL CARE TIME TAKING CARE OF THIS PATIENT: 55 minutes.    Ihor Austin M.D on 02/05/2018 at 1:48 PM  Between 7am to 6pm - Pager - [phone]  After 6pm go to [email or web address] - password [email or web address] [facility]  [facility]  Office  [phone]  CC: Primary care physician; Lauro Regulus, MD

## 2018-02-05 NOTE — ED Triage Notes (Addendum)
Respiratory distress upon arrival to [facility]. Pt was at 91% on 4L. EMS placed pt on CPAP with O2 at 95-97%. Innitial HR-45, possibly junctional rhythm. Pt is Alert. MD Don Perking at bedside. BP-110/83, BS-133. EMS reports rales in lobes. Respiratory at bedside. Pt placed on BIPAP.

## 2018-02-05 NOTE — Progress Notes (Signed)
Pharmacy Electrolyte Monitoring Consult:  Pharmacy consulted to assist in monitoring and replacing electrolytes in this 84 y.o. male admitted on 02/05/2018 with respiratory failure.   Labs:  Sodium (mmol/L)  Date Value  02/05/2018 132 (L)   Potassium (mmol/L)  Date Value  02/05/2018 4.4   Magnesium (mg/dL)  Date Value  09/81/1914 2.7 (H)   Calcium (mg/dL)  Date Value  78/29/5621 8.4 (L)   Albumin (g/dL)  Date Value  30/86/5784 3.2 (L)    Assessment/Plan: Patient received furosemide 40mg  IV x 1 at 1230 and furosemide 60mg  IV x 1 at 1500.   Will order potassium PO x 1.   Will recheck electrolytes with am labs.   Pharmacy will continue to monitor and adjust per consult.   Malaysha Arlen L 02/05/2018 3:52 PM

## 2018-02-05 NOTE — ED Notes (Signed)
Verbal order for Dopamine start at . MD Don Perking

## 2018-02-05 NOTE — ED Notes (Signed)
Pacer Pads placed on pt at this time by Paulino Rily and Pattricia Boss RN per verbal order Don Perking

## 2018-02-05 NOTE — Progress Notes (Signed)
Peripherally Inserted Central Catheter/Midline Placement  The IV Nurse has discussed with the patient and/or persons authorized to consent for the patient, the purpose of this procedure and the potential benefits and risks involved with this procedure.  The benefits include less needle sticks, lab draws from the catheter, and the patient may be discharged home with the catheter. Risks include, but not limited to, infection, bleeding, blood clot (thrombus formation), and puncture of an artery; nerve damage and irregular heartbeat and possibility to perform a PICC exchange if needed/ordered by physician.  Alternatives to this procedure were also discussed.  Bard Power PICC patient education guide, fact sheet on infection prevention and patient information card has been provided to patient /or left at bedside.    PICC/Midline Placement Documentation     Consent obtained with son at bedside   Timmothy Sours 02/05/2018, 6:28 PM

## 2018-02-05 NOTE — Consult Note (Addendum)
Name: Duane Turner MRN: [id number] DOB: [birth year]    ADMISSION DATE:  02/05/2018 CONSULTATION DATE: 02/05/2018  REFERRING MD : Dr. Tobi Bastos   CHIEF COMPLAINT: Respiratory Distress   BRIEF PATIENT DESCRIPTION:  84 yo male admitted with sepsis possibly secondary to pneumonia and UTI, bradycardia, and acute on chronic hypoxic hypercapnic respiratory failure secondary to acute on chronic CHF exacerbation and possible HCAP requiring Bipap and dopamine gtt   SIGNIFICANT EVENTS/STUDIES:  10/16-Pt admitted to stepdown unit on Bipap  HISTORY OF PRESENT ILLNESS:   This is an 84 yo male with a PMH of Chronic systolic CHF, BPH, Arthritis, Anemia, HTN, Hyperaldosteronism, HTN, Diabetes Mellitus, Chronic Kidney Disease, and Anemia.  He presented to [facility] ER on 10/16 from [facility] accompanied by the nurse practitioner from the facility in severe respiratory distress and bradycardia.  Per ER notes the pt has had a 40 pound weight gain over the past 2 weeks.  He was recently admitted to [facility] with bradycardia hr 30-50's on 02/01/18 per Cardiologist Dr. Herbie Baltimore no indication for pacemaker at that time thought possibly secondary to hyperkalemia. Echo results 02/01/18 revealed EF 30% to 35% with mild to moderate mitral regurgitation and moderate to severe tricuspid regurgitation.  During current presentation CXR revealed pulmonary edema and possible pneumonia. He was hypotensive with sbp 80' and hypothermic with temp 87.6 ruling pt in for sepsis. Therefore, he received iv abx and 40 mg iv lasix. Initial EKG revealed sinus bradycardia hr 45, first degree AV block, prolonged QTC.  He received 1 mg atropine and dopamine gtt initiated.  He was subsequently admitted to the stepdown unit by hospitalist team for additional workup and treatment.  PAST MEDICAL HISTORY :   has a past medical history of Anemia, Arthritis, BPH (benign prostatic hyperplasia), Chronic systolic heart failure (HCC), Diabetes mellitus  without complication (HCC), Dysphagia, Hyperaldosteronism (HCC), Hypertension, and Renal disorder.  has no past surgical history on file. Prior to Admission medications   Medication Sig Start Date End Date Taking? Authorizing Provider  acetaminophen (TYLENOL) 325 MG tablet Take 650 mg by mouth every 4 (four) hours as needed for mild pain or moderate pain.   Yes [provider]  aspirin (ASPIRIN EC) 81 MG EC tablet Take 81 mg by mouth daily.     Yes [provider]  insulin NPH-regular Human (NOVOLIN 70/30) (70-30) 100 UNIT/ML injection Inject 10 Units into the skin daily.   Yes [provider]  magnesium oxide (MAG-OX) 400 MG tablet Take 400 mg by mouth daily.   Yes [provider]  midodrine (PROAMATINE) 10 MG tablet Take 10 mg by mouth 3 (three) times daily with meals.   Yes [provider]  multivitamin (ONE-A-DAY MEN'S) TABS tablet Take 1 tablet by mouth daily.   Yes [provider]  potassium chloride (K-DUR,KLOR-CON) 10 MEQ tablet Take 20 mEq by mouth daily.   Yes [provider]  pravastatin (PRAVACHOL) 40 MG tablet Take 40 mg by mouth at bedtime.     Yes [provider]  Skin Protectants, Misc. (EUCERIN) cream Apply 1 application topically daily as needed for dry skin. Apply to bilateral lower extremities   Yes [provider]  torsemide (DEMADEX) 20 MG tablet Take 40 mg by mouth 2 (two) times daily.   Yes [provider]  Vitamin D, Ergocalciferol, (DRISDOL) 50000 units CAPS capsule Take 50,000 Units by mouth every 7 (seven) days.   Yes [provider]  insulin aspart (NOVOLOG) 100 UNIT/ML injection  Inject 0-5 Units into the skin at bedtime. CBG < 70: implement hypoglycemia protocol  CBG 70 - 120: 0 units  CBG 121 - 150: 0 units  CBG 151 - 200: 0 units  CBG 201 - 250: 2 units  CBG 251 - 300: 3 units  CBG 301 - 350: 4 units  CBG 351 - 400: 5 units  CBG > 400 call MD Patient not taking:  Reported on 02/05/2018 02/02/18   Adrian Saran, MD  insulin aspart (NOVOLOG) 100 UNIT/ML injection Inject 0-9 Units into the skin 3 (three) times daily with meals. CBG < 70: implement hypoglycemia protocol  CBG 70 - 120: 0 units  CBG 121 - 150: 1 unit  CBG 151 - 200: 2 units  CBG 201 - 250: 3 units  CBG 251 - 300: 5 units  CBG 301 - 350: 7 units  CBG 351 - 400 9 units  CBG > 400 call MD Patient not taking: Reported on 02/05/2018 02/02/18   Adrian Saran, MD  lisinopril (PRINIVIL,ZESTRIL) 10 MG tablet Take 1 tablet (10 mg total) by mouth at bedtime. For blood pressure Patient not taking: Reported on 02/05/2018 02/02/18   Adrian Saran, MD   No Known Allergies  FAMILY HISTORY:  family history is not on file. SOCIAL HISTORY:  reports that he has never smoked. He has never used smokeless tobacco. He reports that he drank alcohol. He reports that he has current or past drug history.  REVIEW OF SYSTEMS:   Unable to assess pt on Bipap in respiratory distress   SUBJECTIVE:  Unable to assess pt on Bipap in respiratory distress  VITAL SIGNS: Temp:  [87.6 F (30.9 C)-88.5 F (31.4 C)] 88 F (31.1 C) (10/16 1426) Pulse Rate:  [36-88] 88 (10/16 1426) Resp:  [0-28] 21 (10/16 1426) BP: (72-129)/(38-94) 129/79 (10/16 1426) SpO2:  [85 %-100 %] 93 % (10/16 1426) Weight:  [81.7 kg] 81.7 kg (10/16 1228)  PHYSICAL EXAMINATION: General: acutely ill appearing elderly male in severe respiratory distress on Bipap Neuro: lethargic, not following commands, PERRL  HEENT: JVD present  Cardiovascular: nsr, no R/G Lungs: diffuse crackles throughout, tachypneic, and labored  Abdomen: hypoactive BS x4, obese, distended, and firm Musculoskeletal: 2+ generalized edema  Skin: chronic vascular discoloration bilateral lower extremities   Recent Labs  Lab 02/01/18 0221 02/02/18 0521 02/05/18 1222  NA 135 133* 132*  K 4.5 4.7 4.4  CL 104 103 102  CO2 26 24 22   BUN 77* 81* 74*  CREATININE 1.57* 1.61*  1.51*  GLUCOSE 58* 82 149*   Recent Labs  Lab 01/31/18 1732 02/01/18 0221 02/05/18 1222  HGB 10.2* 9.9* 10.6*  HCT 30.6* 30.1* 33.0*  WBC 2.5* 2.4* 2.9*  PLT 137* 127* 105*   Dg Chest Portable 1 View  Result Date: 02/05/2018 CLINICAL DATA:  Short of breath, respiratory distress EXAM: PORTABLE CHEST 1 VIEW COMPARISON:  Portable chest x-ray of 01/31/2017 FINDINGS: Moderate cardiomegaly again is noted. There is patchy airspace disease bilaterally right greater than left most consistent with multifocal pneumonia. Small left effusion cannot be excluded. No bony abnormality is seen. IMPRESSION: 1. New patchy airspace disease right greater than left most consistent with multifocal pneumonia. 2. Cardiomegaly.  Possible small effusions. Electronically Signed   By: Dwyane Dee M.D.   On: 02/05/2018 13:10    ASSESSMENT / PLAN:  Acute on chronic hypercapnic hypoxic respiratory failure secondary to acute systolic CHF exacerbation  Possible pneumonia Prn Bipap for dyspnea and/or hypoxia  Scheduled and  prn bronchodilator therapy  Nebulized steroids  Will give additional dose of 60 mg iv lasix x1 60 mg iv solumedrol x1 dose Continue cefepime and vancomycin for now if MRSA PCR negative will stop vancomycin  Trend WBC and monitor fever curve  Trend PCT  Follow cultures  Repeat CXR in am   Septic shock possibly secondary to possible HCAP and UTI  Bradycardia  Mildly elevated troponin likely demand ischemia in setting of respiratory failure  Acute on chronic systolic CHF (Echo 02/01/18 EF 30%-35% with mild to moderate mitral regurgitation and moderate-severe tricuspid regurgitation) Continuous telemetry monitoring  Trend troponin's Prn dobutamine to maintain map >65 Discontinue midodrine  Hold outpatient antihypertensives  TSH pending  Acute on chronic renal failure  Hyponatremia Trend BMP  Replace electrolytes as indicated  Monitor UOP Avoid nephrotoxic medications   Abdominal  Distension  KUB pending  Will start bowel regimen  Keep NPO for now until off Bipap  SUP px: iv pepcid   Chronic anemia no obvious acute blood loss Thrombocytopenia  VTE px: SCD's for now  Trend CBC  Monitor for s/sx of bleeding and transfuse for hgb <7  Type II Diabetes Mellitus  CBG's q4hrs  SSI   -I spoke with pts son Goldman Birchall to discuss plan of care and pt is high risk for mechanical intubation.  However, Norwood Quezada stated he did not want his father to be mechanically intubated and wanted to change his code status to DO NOT RESUSCITATE.  He also informed me if his father did not improve with medical management he wanted to transition his father to hospice with comfort measures only.  Therefore, pts code status changed to DO NOT RESUSCITATE per pts son request (the pt is unable to participate in plan of care he is currently on Bipap in severe respiratory distress and lethargic).  Sonda Rumble, AGNP  Pulmonary/Critical Care Pager [phone] (please enter 7 digits) PCCM Consult Pager [phone] (please enter 7 digits)   STAFF NOTE: I, Dr Roseanne Reno have personally reviewed patient's available data, including medical history, events of note, physical examination and test results as part of my evaluation. I have discussed with resident/NP and other care providers such as pharmacist, RN and RRT.  In addition,  I personally evaluated patient and elicited key findings of   Patient with EF of 30 to 35%, hypothermia, bradycardia-ABG showed severe acidosis combined metabolic plus respiratory acidosis chest x-ray showed pleural effusion with questionable aspiration pneumonia - UA was suggestive of UTI - After review of initial blood gas seems like patient might needed to be intubated however case was discussed with patient's POA patient's son who suggested patient is DNR and no headache measures not to go on a ventilator but continue conservative management okay with placing  line if needed -Patient is slowly improving with warming blanket -Cardiology consult is pending  Assessment: Respiratory failure - Pulmonary edema - Hypothermia bradycardia questionable sepsis -Abdominal distention KUB showed just stool  Plan: - DNR status - Increase respiratory rate to 22 on BiPAP - Lasix 60 was given - PICC line -Start dobutamine - Follow cardiology consultation -Check TSH -Start Vanco/cefepime adjust with culture - Continue Lasix as tolerated consider drip if needed -GI DVT prophylaxis   Skin/Wound: chronic changes   Electrolytes: Replace electrolytes per ICU electrolyte replacement protocol.   IVF: none  Nutrition: NPO for now   Prophylaxis: DVT Prophylaxis with Heparin,. GI Prophylaxis.   Restraints: None  PT/OT eval and treat. OOB when appropriate.  Lines/Tubes:  01/1618 foley  none central line.  ADVANCE DIRECTIVE:DNR  FAMILY DISCUSSION:spoke with son  Quality Care: PPI, DVT prophylaxis, HOB elevated, Infection control all reviewed and addressed.  Events and notes from last 24 hours reviewed. Care plan discussed on multidisciplinary rounds  CC TIME:31 min    Old records reviewed discussed results and management plan with patient  Images personally reviewed and results and labs reviewed and discussed with patient.  All medication reviewed and adjusted  Further management depending on test results and work up as outlined above.    Roseanne Reno, M.D

## 2018-02-05 NOTE — ED Provider Notes (Addendum)
[facility] Emergency Department Provider Note  ____________________________________________  Time seen: Approximately 12:28 PM  I have reviewed the triage vital signs and the nursing notes.   HISTORY  Chief Complaint Respiratory Distress  Level 5 caveat:  Portions of the history and physical were unable to be obtained due to severe respiratory distress, dementia   HPI Duane Turner is a 84 y.o. male with a history of CHF with EF 30 to 35%, diabetes, chronic kidney disease, hypertension, anemia, hyperlipidemia who presents for evaluation of respiratory distress.  Patient is accompanied by a nurse practitioner from the nursing home.  According to her patient has gained 40 pounds over the last 2 weeks. Was admitted recently for bradycardia and discharged back to the nursing home with no pacemaker. This morning patient had severe shortness of breath, lower extremity swelling. No fever.  Upon arrival to the facility EMS noted patient was satting 91% on 4 L and he was placed on CPAP.  Initial EKG showing possible junctional rhythm with heart rate in the 40s. Patient arrives on CPAP, alert.  Past Medical History:  Diagnosis Date  . Anemia   . Arthritis   . BPH (benign prostatic hyperplasia)   . Chronic systolic heart failure (HCC)   . Diabetes mellitus without complication (HCC)   . Dysphagia   . Hyperaldosteronism (HCC)   . Hypertension   . Renal disorder     Patient Active Problem List   Diagnosis Date Noted  . Sepsis (HCC) 02/05/2018  . Hyperkalemia 01/31/2018  . DIABETES MELLITUS, TYPE II, UNCONTROLLED 05/17/2009  . HYPERTENSION, BENIGN ESSENTIAL 05/17/2009  . TOBACCO USER 01/29/2009  . ERECTILE DYSFUNCTION 12/23/2007  . HYPERLIPIDEMIA 11/29/2006  . ANEMIA NOS 11/01/2006  . PROTEINURIA, MILD 11/01/2006    History reviewed. No pertinent surgical history.  Prior to Admission medications   Medication Sig Start Date End Date Taking? Authorizing  Provider  acetaminophen (TYLENOL) 325 MG tablet Take 650 mg by mouth every 4 (four) hours as needed for mild pain or moderate pain.   Yes [provider]  aspirin (ASPIRIN EC) 81 MG EC tablet Take 81 mg by mouth daily.     Yes [provider]  insulin NPH-regular Human (NOVOLIN 70/30) (70-30) 100 UNIT/ML injection Inject 10 Units into the skin daily.   Yes [provider]  magnesium oxide (MAG-OX) 400 MG tablet Take 400 mg by mouth daily.   Yes [provider]  midodrine (PROAMATINE) 10 MG tablet Take 10 mg by mouth 3 (three) times daily with meals.   Yes [provider]  multivitamin (ONE-A-DAY MEN'S) TABS tablet Take 1 tablet by mouth daily.   Yes [provider]  potassium chloride (K-DUR,KLOR-CON) 10 MEQ tablet Take 20 mEq by mouth daily.   Yes [provider]  pravastatin (PRAVACHOL) 40 MG tablet Take 40 mg by mouth at bedtime.     Yes [provider]  Skin Protectants, Misc. (EUCERIN) cream Apply 1 application topically daily as needed for dry skin. Apply to bilateral lower extremities   Yes [provider]  torsemide (DEMADEX) 20 MG tablet Take 40 mg by mouth 2 (two) times daily.   Yes [provider]  Vitamin D, Ergocalciferol, (DRISDOL) 50000 units CAPS capsule Take 50,000 Units by mouth every 7 (seven) days.   Yes [provider]  insulin aspart (NOVOLOG) 100 UNIT/ML injection Inject 0-5 Units into the skin at bedtime. CBG < 70: implement hypoglycemia protocol  CBG 70 - 120: 0  units  CBG 121 - 150: 0 units  CBG 151 - 200: 0 units  CBG 201 - 250: 2 units  CBG 251 - 300: 3 units  CBG 301 - 350: 4 units  CBG 351 - 400: 5 units  CBG > 400 call MD Patient not taking: Reported on 02/05/2018 02/02/18   Adrian Saran, MD  insulin aspart (NOVOLOG) 100 UNIT/ML injection Inject 0-9 Units into the skin 3 (three) times daily with meals. CBG < 70: implement hypoglycemia protocol  CBG 70 - 120: 0  units  CBG 121 - 150: 1 unit  CBG 151 - 200: 2 units  CBG 201 - 250: 3 units  CBG 251 - 300: 5 units  CBG 301 - 350: 7 units  CBG 351 - 400 9 units  CBG > 400 call MD Patient not taking: Reported on 02/05/2018 02/02/18   Adrian Saran, MD  lisinopril (PRINIVIL,ZESTRIL) 10 MG tablet Take 1 tablet (10 mg total) by mouth at bedtime. For blood pressure Patient not taking: Reported on 02/05/2018 02/02/18   Adrian Saran, MD    Allergies Patient has no known allergies.  No family history on file.  Social History Social History   Tobacco Use  . Smoking status: Never Smoker  . Smokeless tobacco: Never Used  Substance Use Topics  . Alcohol use: Not Currently  . Drug use: Not Currently    Review of Systems  Constitutional: Negative for fever. + 40lb weight gain Cardiovascular: Negative for chest pain. Respiratory: + shortness of breath. Gastrointestinal: Negative for abdominal pain, vomiting or diarrhea. Genitourinary: Negative for dysuria. Musculoskeletal: Negative for back pain. Skin: Negative for rash. Neurological: Negative for headaches, weakness or numbness. Psych: No SI or HI  ____________________________________________   PHYSICAL EXAM:  VITAL SIGNS: ED Triage Vitals  Enc Vitals Group     BP --      Pulse --      Resp 02/05/18 1223 (!) 28     Temp --      Temp src --      SpO2 02/05/18 1223 99 %     Weight --      Height --      Head Circumference --      Peak Flow --      Pain Score 02/05/18 1227 6     Pain Loc --      Pain Edu? --      Excl. in GC? --     Constitutional: Awake, confused, severe respiratory distress with audible coarse rhonchi. HEENT:      Head: Normocephalic and atraumatic.         Eyes: Conjunctivae are normal. Sclera is non-icteric.       Mouth/Throat: Mucous membranes are moist.       Neck: Supple with no signs of meningismus. Cardiovascular: Bradycardic with regular rhythm. No murmurs, gallops, or rubs.  Respiratory: Increased  work of breathing, satting 95% on CPAP, coarse rhonchi and crackles bilateral lung fields  Gastrointestinal: Soft, non tender, and non distended with positive bowel sounds. No rebound or guarding. Musculoskeletal: 2+ pitting edema bilaterally  neurologic: Normal speech and language. Face is symmetric. Moving all extremities. No gross focal neurologic deficits are appreciated. Skin: Skin is warm, dry and intact. No rash noted. Psychiatric: Mood and affect are normal. Speech and behavior are normal.  ____________________________________________   LABS (all labs ordered are listed, but only abnormal results are displayed)  Labs Reviewed  CBC - Abnormal; Notable for the following  components:      Result Value   WBC 2.9 (*)    RBC 3.60 (*)    Hemoglobin 10.6 (*)    HCT 33.0 (*)    Platelets 105 (*)    nRBC 1.4 (*)    All other components within normal limits  BRAIN NATRIURETIC PEPTIDE - Abnormal; Notable for the following components:   B Natriuretic Peptide 589.0 (*)    All other components within normal limits  TROPONIN I - Abnormal; Notable for the following components:   Troponin I 0.03 (*)    All other components within normal limits  COMPREHENSIVE METABOLIC PANEL - Abnormal; Notable for the following components:   Sodium 132 (*)    Glucose, Bld 149 (*)    BUN 74 (*)    Creatinine, Ser 1.51 (*)    Calcium 8.4 (*)    Albumin 3.2 (*)    ALT 51 (*)    GFR calc non Af Amer 41 (*)    GFR calc Af Amer 48 (*)    All other components within normal limits  MAGNESIUM - Abnormal; Notable for the following components:   Magnesium 2.7 (*)    All other components within normal limits  CULTURE, BLOOD (ROUTINE X 2)  CULTURE, BLOOD (ROUTINE X 2)  URINE CULTURE  MRSA PCR SCREENING  LACTIC ACID, PLASMA  LACTIC ACID, PLASMA  URINALYSIS, COMPLETE (UACMP) WITH MICROSCOPIC  PROCALCITONIN  TROPONIN I  TROPONIN I  TROPONIN I   ____________________________________________  EKG  ED ECG  REPORT I, Nita Sickle, the attending physician, personally viewed and interpreted this ECG.  Sinus bradycardia, rate of 45, first-degree AV block, prolonged QTC, normal axis, T wave flattening diffusely with no ST elevations or depressions.  No significant changes when compared to prior. ____________________________________________  RADIOLOGY  I have personally reviewed the images performed during this visit and I agree with the Radiologist's read.   Interpretation by Radiologist:  Dg Chest Portable 1 View  Result Date: 02/05/2018 CLINICAL DATA:  Short of breath, respiratory distress EXAM: PORTABLE CHEST 1 VIEW COMPARISON:  Portable chest x-ray of 01/31/2017 FINDINGS: Moderate cardiomegaly again is noted. There is patchy airspace disease bilaterally right greater than left most consistent with multifocal pneumonia. Small left effusion cannot be excluded. No bony abnormality is seen. IMPRESSION: 1. New patchy airspace disease right greater than left most consistent with multifocal pneumonia. 2. Cardiomegaly.  Possible small effusions. Electronically Signed   By: Dwyane Dee M.D.   On: 02/05/2018 13:10      ____________________________________________   PROCEDURES  Procedure(s) performed: None Procedures Critical Care performed: yes  CRITICAL CARE Performed by: Nita Sickle  ?  Total critical care time: 50 min  Critical care time was exclusive of separately billable procedures and treating other patients.  Critical care was necessary to treat or prevent imminent or life-threatening deterioration.  Critical care was time spent personally by me on the following activities: development of treatment plan with patient and/or surrogate as well as nursing, discussions with consultants, evaluation of patient's response to treatment, examination of patient, obtaining history from patient or surrogate, ordering and performing treatments and interventions, ordering and review of  laboratory studies, ordering and review of radiographic studies, pulse oximetry and re-evaluation of patient's condition.  ____________________________________________   INITIAL IMPRESSION / ASSESSMENT AND PLAN / ED COURSE  84 y.o. male with a history of CHF with EF 30 to 35%, diabetes, chronic kidney disease, hypertension, anemia, hyperlipidemia who presents for evaluation of respiratory distress.  Patient arrives in severe respiratory distress, hypoxic, volume overloaded on exam with elevated JVD, bilateral coarse rhonchi and crackles, 2+ pitting edema.  Patient was transitioned from CPAP to BiPAP.  Started on IV Lasix.  Blood pressure soft with systolics in the 90s therefore nitroglycerin was held.  EKG showing sinus bradycardia.  Questionable junctional rhythm versus block is seen last week per facility RN therefore pacer pads were applied.  Chest x-ray confirms pulmonary edema.  We will continue to monitor patient closely, labs are pending.  Anticipate admission.    _________________________ 1:07 PM on 02/05/2018 -----------------------------------------  Electrolytes and kidney function within patient's baseline.  Patient received 1mg  of atropine with significant improvement of his bradycardia and hypotension.  Patient remains confused.  Will discuss with the ICU for admission.  _________________________ 1:16 PM on 02/05/2018 -----------------------------------------  CXR concerning for PNA, will treat for HCAP due to recent hospitalization. Patient meets sepsis criteria, will hold off IVF due to CHF exacerbation.   _________________________ 2:15 PM on 02/05/2018 -----------------------------------------  BP continues to trend down after 500cc bolus, will give another 500 cc bolus and start patient on dopamine for help with hypotension and cardiac contractility as well.  As part of my medical decision making, I reviewed the following data within the electronic MEDICAL RECORD NUMBER  History obtained from NP from SNF, Nursing notes reviewed and incorporated, Labs reviewed , EKG interpreted , Old EKG reviewed, Old chart reviewed, Radiograph reviewed , Discussed with admitting physician , Notes from prior ED visits and [place] Controlled Substance Database    Pertinent labs & imaging results that were available during my care of the patient were reviewed by me and considered in my medical decision making (see chart for details).    ____________________________________________   FINAL CLINICAL IMPRESSION(S) / ED DIAGNOSES  Final diagnoses:  Acute on chronic respiratory failure with hypoxia (HCC)  Acute on chronic congestive heart failure, unspecified heart failure type (HCC)  Bradycardia  HCAP (healthcare-associated pneumonia)  Sepsis with encephalopathy and septic shock, due to unspecified organism [facility])      NEW MEDICATIONS STARTED DURING THIS VISIT:  ED Discharge Orders    None       Note:  This document was prepared using Dragon voice recognition software and may include unintentional dictation errors.    Nita Sickle, MD 02/05/18 1309    Don Perking, [place], MD 02/05/18 1317    Nita Sickle, MD 02/05/18 [phone]

## 2018-02-05 NOTE — Progress Notes (Signed)
Pharmacy Antibiotic Note  Duane Turner is a 84 y.o. male admitted on 02/05/2018 with sepsis.  Patient admitted from [facility] facility and is requiring BiPAP. Pharmacy has been consulted for cefepime and vancomcyin dosing. Patient received cefepime 1 g and vancomycin 1 g x 1 in ED.   Plan: Will continue cefepime 2g IV Q12hr with next dose at 2200.   Will continue vancomycin 1250mg  IV Q24hr for goal trough of 15-20. Will order stacked dose to be given at 2200. Will follow renal function closely.   MRSA PCR and procalcitonin levels pending.   Height: 5\' 8"  (172.7 cm) Weight: 180 lb 1.9 oz (81.7 kg) IBW/kg (Calculated) : 68.4  Temp (24hrs), Avg:87.9 F (31.1 C), Min:87.6 F (30.9 C), Max:88.5 F (31.4 C)  Recent Labs  Lab 01/31/18 1732 02/01/18 0221 02/02/18 0521 02/05/18 1222 02/05/18 1341  WBC 2.5* 2.4*  --  2.9*  --   CREATININE 1.45* 1.57* 1.61* 1.51*  --   LATICACIDVEN  --   --   --   --  1.4    Estimated Creatinine Clearance: 36.5 mL/min (A) (by C-G formula based on SCr of 1.51 mg/dL (H)).    No Known Allergies  Antimicrobials this admission: Cefepime 10/16 >>  Vancomycin 10/16 >>   Dose adjustments this admission: N/A  Microbiology results: 10/16 BCx: pending  10/16 UCx: pending  10/16 MRSA PCR: pending   Thank you for allowing pharmacy to be a part of this patient's care.  Simpson,Michael L 02/05/2018 3:58 PM

## 2018-02-05 NOTE — ED Notes (Addendum)
1 mg atropine given verbal order by MD Don Perking. MD at bedside

## 2018-02-05 NOTE — ED Notes (Signed)
Unable to get temp at this time. °

## 2018-02-05 NOTE — Progress Notes (Signed)
CODE SEPSIS - PHARMACY COMMUNICATION  **Broad Spectrum Antibiotics should be administered within 1 hour of Sepsis diagnosis**  Time Code Sepsis Called/Page Received: 1336  Antibiotics Ordered: cefepime/vancomycin  Time of 1st antibiotic administration: cefepime/vancomycin  Additional action taken by pharmacy: 1350 Cefepime  If necessary, Name of Provider/Nurse Contacted: N/A    Harlee Eckroth L ,PharmD Clinical Pharmacist  02/05/2018  1:22 PM

## 2018-02-05 NOTE — ED Notes (Addendum)
Critical result of troponin 0.03 given to MD Don Perking

## 2018-02-05 NOTE — ED Notes (Signed)
Pt had large BM. Pt cleaned up and repositioned at this time

## 2018-02-06 ENCOUNTER — Inpatient Hospital Stay: Payer: Medicare Other

## 2018-02-06 LAB — BLOOD GAS, ARTERIAL
ACID-BASE DEFICIT: 3.5 mmol/L — AB (ref 0.0–2.0)
ACID-BASE DEFICIT: 2.7 mmol/L — AB (ref 0.0–2.0)
BICARBONATE: 22.1 mmol/L (ref 20.0–28.0)
BICARBONATE: 21.8 mmol/L (ref 20.0–28.0)
Delivery systems: POSITIVE
Expiratory PAP: 5
FIO2: 0.4
FIO2: 0.36
Inspiratory PAP: 15
MECHANICAL RATE: 8
O2 SAT: 95.6 %
O2 Saturation: 98.7 %
PATIENT TEMPERATURE: 37
PH ART: 7.34 — AB (ref 7.350–7.450)
Patient temperature: 37
pCO2 arterial: 41 mmHg (ref 32.0–48.0)
pCO2 arterial: 36 mmHg (ref 32.0–48.0)
pH, Arterial: 7.39 (ref 7.350–7.450)
pO2, Arterial: 128 mmHg — ABNORMAL HIGH (ref 83.0–108.0)
pO2, Arterial: 80 mmHg — ABNORMAL LOW (ref 83.0–108.0)

## 2018-02-06 LAB — CBC WITH DIFFERENTIAL/PLATELET
Abs Immature Granulocytes: 0.06 10*3/uL (ref 0.00–0.07)
BASOS ABS: 0 10*3/uL (ref 0.0–0.1)
BASOS PCT: 0 %
EOS ABS: 0.2 10*3/uL (ref 0.0–0.5)
EOS PCT: 4 %
HCT: 27.2 % — ABNORMAL LOW (ref 39.0–52.0)
Hemoglobin: 9 g/dL — ABNORMAL LOW (ref 13.0–17.0)
IMMATURE GRANULOCYTES: 1 %
Lymphocytes Relative: 2 %
Lymphs Abs: 0.1 10*3/uL — ABNORMAL LOW (ref 0.7–4.0)
MCH: 30.2 pg (ref 26.0–34.0)
MCHC: 33.1 g/dL (ref 30.0–36.0)
MCV: 91.3 fL (ref 80.0–100.0)
Monocytes Absolute: 0.4 10*3/uL (ref 0.1–1.0)
Monocytes Relative: 7 %
NRBC: 1.2 % — AB (ref 0.0–0.2)
Neutro Abs: 4.9 10*3/uL (ref 1.7–7.7)
Neutrophils Relative %: 86 %
PLATELETS: 93 10*3/uL — AB (ref 150–400)
RBC: 2.98 MIL/uL — AB (ref 4.22–5.81)
RDW: 15.3 % (ref 11.5–15.5)
WBC: 5.8 10*3/uL (ref 4.0–10.5)

## 2018-02-06 LAB — BASIC METABOLIC PANEL
Anion gap: 9 (ref 5–15)
BUN: 77 mg/dL — ABNORMAL HIGH (ref 8–23)
CALCIUM: 8.4 mg/dL — AB (ref 8.9–10.3)
CO2: 25 mmol/L (ref 22–32)
CREATININE: 2.07 mg/dL — AB (ref 0.61–1.24)
Chloride: 102 mmol/L (ref 98–111)
GFR, EST AFRICAN AMERICAN: 33 mL/min — AB (ref >60)
GFR, EST NON AFRICAN AMERICAN: 28 mL/min — AB (ref >60)
Glucose, Bld: 102 mg/dL — ABNORMAL HIGH (ref 70–99)
Potassium: 5.1 mmol/L (ref 3.5–5.1)
SODIUM: 136 mmol/L (ref 135–145)

## 2018-02-06 LAB — GLUCOSE, CAPILLARY
GLUCOSE-CAPILLARY: 161 mg/dL — AB (ref 70–99)
GLUCOSE-CAPILLARY: 101 mg/dL — AB (ref 70–99)
GLUCOSE-CAPILLARY: 113 mg/dL — AB (ref 70–99)
Glucose-Capillary: 110 mg/dL — ABNORMAL HIGH (ref 70–99)
Glucose-Capillary: 118 mg/dL — ABNORMAL HIGH (ref 70–99)

## 2018-02-06 LAB — COMPREHENSIVE METABOLIC PANEL
ALBUMIN: 2.8 g/dL — AB (ref 3.5–5.0)
ALT: 42 U/L (ref 0–44)
ANION GAP: 9 (ref 5–15)
AST: 30 U/L (ref 15–41)
Alkaline Phosphatase: 81 U/L (ref 38–126)
BUN: 77 mg/dL — AB (ref 8–23)
CHLORIDE: 98 mmol/L (ref 98–111)
CO2: 23 mmol/L (ref 22–32)
Calcium: 8.1 mg/dL — ABNORMAL LOW (ref 8.9–10.3)
Creatinine, Ser: 1.8 mg/dL — ABNORMAL HIGH (ref 0.61–1.24)
GFR calc Af Amer: 39 mL/min — ABNORMAL LOW (ref >60)
GFR calc non Af Amer: 33 mL/min — ABNORMAL LOW (ref >60)
Glucose, Bld: 239 mg/dL — ABNORMAL HIGH (ref 70–99)
POTASSIUM: 5.5 mmol/L — AB (ref 3.5–5.1)
Sodium: 130 mmol/L — ABNORMAL LOW (ref 135–145)
TOTAL PROTEIN: 6.5 g/dL (ref 6.5–8.1)
Total Bilirubin: 0.9 mg/dL (ref 0.3–1.2)

## 2018-02-06 LAB — TROPONIN I
Troponin I: 0.11 ng/mL (ref <0.03)
Troponin I: 0.25 ng/mL (ref <0.03)

## 2018-02-06 LAB — T4, FREE: Free T4: 0.93 ng/dL (ref 0.82–1.77)

## 2018-02-06 LAB — PROTIME-INR
INR: 1.75
Prothrombin Time: 20.2 seconds — ABNORMAL HIGH (ref 11.4–15.2)

## 2018-02-06 LAB — MAGNESIUM: Magnesium: 2.5 mg/dL — ABNORMAL HIGH (ref 1.7–2.4)

## 2018-02-06 LAB — PROCALCITONIN: Procalcitonin: 8.27 ng/mL

## 2018-02-06 LAB — PHOSPHORUS: PHOSPHORUS: 3.2 mg/dL (ref 2.5–4.6)

## 2018-02-06 LAB — BRAIN NATRIURETIC PEPTIDE: B NATRIURETIC PEPTIDE 5: 1391 pg/mL — AB (ref 0.0–100.0)

## 2018-02-06 MED ORDER — DEXTROSE 50 % IV SOLN: 1.0000 | Freq: Once | INTRAVENOUS | Status: DC

## 2018-02-06 MED ORDER — DEXTROSE 50 % IV SOLN
1.0000 | Freq: Once | INTRAVENOUS | Status: AC
  Administered 2018-02-06: 50 mL via INTRAVENOUS
  Filled 2018-02-06: qty 50

## 2018-02-06 MED ORDER — SODIUM POLYSTYRENE SULFONATE 15 GM/60ML PO SUSP
15.0000 g | Freq: Once | ORAL | Status: DC
  Filled 2018-02-06: qty 60

## 2018-02-06 MED ORDER — OLANZAPINE 5 MG PO TABS
5.0000 mg | ORAL_TABLET | Freq: Every day | ORAL | Status: DC
  Filled 2018-02-06 (×4): qty 1

## 2018-02-06 MED ORDER — FUROSEMIDE 10 MG/ML IJ SOLN: 60.0000 mg | Freq: Once | INTRAMUSCULAR | Status: DC

## 2018-02-06 MED ORDER — INSULIN ASPART 100 UNIT/ML ~~LOC~~ SOLN
0.0000 [IU] | SUBCUTANEOUS | Status: DC
  Administered 2018-02-06: 2 [IU] via SUBCUTANEOUS
  Administered 2018-02-07 – 2018-02-08 (×4): 1 [IU] via SUBCUTANEOUS
  Filled 2018-02-06 (×4): qty 1

## 2018-02-06 MED ORDER — CALCIUM GLUCONATE-NACL 1-0.675 GM/50ML-% IV SOLN
1.0000 g | Freq: Once | INTRAVENOUS | Status: AC
  Administered 2018-02-06: 1000 mg via INTRAVENOUS
  Filled 2018-02-06: qty 50

## 2018-02-06 MED ORDER — INSULIN ASPART 100 UNIT/ML IV SOLN
10.0000 [IU] | Freq: Once | INTRAVENOUS | Status: AC
  Administered 2018-02-06: 10 [IU] via INTRAVENOUS
  Filled 2018-02-06: qty 0.1

## 2018-02-06 MED ORDER — SODIUM CHLORIDE 0.9 % IV SOLN
3.0000 g | Freq: Two times a day (BID) | INTRAVENOUS | Status: DC
  Administered 2018-02-06: 3 g via INTRAVENOUS
  Filled 2018-02-06 (×2): qty 3

## 2018-02-06 MED ORDER — INSULIN ASPART 100 UNIT/ML IV SOLN: 10.0000 [IU] | Freq: Once | INTRAVENOUS | Status: DC

## 2018-02-06 MED ORDER — SODIUM POLYSTYRENE SULFONATE 15 GM/60ML PO SUSP
30.0000 g | Freq: Once | ORAL | Status: AC
  Administered 2018-02-06: 30 g via RECTAL
  Filled 2018-02-06: qty 120

## 2018-02-06 MED ORDER — FUROSEMIDE 10 MG/ML IJ SOLN
60.0000 mg | INTRAMUSCULAR | Status: AC
  Administered 2018-02-06: 60 mg via INTRAVENOUS
  Filled 2018-02-06: qty 6

## 2018-02-06 MED ORDER — SODIUM CHLORIDE 0.9 % IV SOLN
3.0000 g | Freq: Four times a day (QID) | INTRAVENOUS | Status: DC
  Administered 2018-02-06 – 2018-02-07 (×2): 3 g via INTRAVENOUS
  Filled 2018-02-06 (×4): qty 3

## 2018-02-06 MED ORDER — NOREPINEPHRINE 16 MG/250ML-% IV SOLN
0.0000 ug/min | INTRAVENOUS | Status: DC
  Administered 2018-02-06: 2 ug/min via INTRAVENOUS
  Filled 2018-02-06: qty 250

## 2018-02-06 NOTE — Progress Notes (Signed)
Restarted Neo @4mcg 

## 2018-02-06 NOTE — Progress Notes (Signed)
Name: Duane Turner MRN: [id number] DOB: Jun 25, 1932    ADMISSION DATE:  02/05/2018 CONSULTATION DATE: 02/05/2018  REFERRING MD : Dr. Tobi Bastos   CHIEF COMPLAINT: Respiratory Distress   BRIEF PATIENT DESCRIPTION:  84 yo male admitted with sepsis possibly secondary to pneumonia and UTI, bradycardia, and acute on chronic hypoxic hypercapnic respiratory failure secondary to acute on chronic CHF exacerbation and possible HCAP requiring Bipap and dopamine gtt   SIGNIFICANT EVENTS/STUDIES:  10/16-Pt admitted to stepdown unit on Bipap 02/06/2018: Overnight managed with BiPAP, potassium slightly elevated, mentation still not improving  Cultures: Blood negative so far Antibiotics: Vanco plus cefepime 1016-02/06/2018 Unasyn 02/06/2018  HISTORY OF PRESENT ILLNESS:   This is an 84 yo male with a PMH of Chronic systolic CHF, BPH, Arthritis, Anemia, HTN, Hyperaldosteronism, HTN, Diabetes Mellitus, Chronic Kidney Disease, and Anemia.  He presented to [facility] ER on 10/16 from [facility] accompanied by the nurse practitioner from the facility in severe respiratory distress and bradycardia.  Per ER notes the pt has had a 40 pound weight gain over the past 2 weeks.  He was recently admitted to [facility] with bradycardia hr 30-50's on 02/01/18 per Cardiologist Dr. Herbie Baltimore no indication for pacemaker at that time thought possibly secondary to hyperkalemia. Echo results 02/01/18 revealed EF 30% to 35% with mild to moderate mitral regurgitation and moderate to severe tricuspid regurgitation.  During current presentation CXR revealed pulmonary edema and possible pneumonia. He was hypotensive with sbp 80' and hypothermic with temp 87.6 ruling pt in for sepsis. Therefore, he received iv abx and 40 mg iv lasix. Initial EKG revealed sinus bradycardia hr 45, first degree AV block, prolonged QTC.  He received 1 mg atropine and dopamine gtt initiated.  He was subsequently admitted to the stepdown unit by hospitalist team  for additional workup and treatment.  Subjective: Patient is drowsy not following any commands, evaluated by cardiology who suggested to taper off dobutamine- not impressed with bradycardia, TSH was checked it was okay, patient received morphine and Ativan last night which might have contributed to his AMS in the morning - Blood pressure is 112 x 54, hypothermia improved -Mild worsening of creatinine of 1.80 noted  VITAL SIGNS: Temp:  [87.6 F (30.9 C)-99.9 F (37.7 C)] 99.9 F (37.7 C) (10/17 1100) Pulse Rate:  [38-112] 106 (10/17 1100) Resp:  [0-37] 30 (10/17 1100) BP: (72-158)/(38-144) 112/54 (10/17 1100) SpO2:  [85 %-100 %] 100 % (10/17 1100) FiO2 (%):  [40 %] 40 % (10/17 0759)  PHYSICAL EXAMINATION: General: acutely ill appearing elderly male in severe respiratory distress on Bipap Neuro: lethargic, not following commands, PERRL  HEENT: JVD present  Cardiovascular: nsr, no R/G Lungs: diffuse crackles throughout, tachypneic, and labored  Abdomen: hypoactive BS x4, obese, distended, and firm Musculoskeletal: 2+ generalized edema  Skin: chronic vascular discoloration bilateral lower extremities   Recent Labs  Lab 02/02/18 0521 02/05/18 1222 02/06/18 0055  NA 133* 132* 130*  K 4.7 4.4 5.5*  CL 103 102 98  CO2 24 22 23   BUN 81* 74* 77*  CREATININE 1.61* 1.51* 1.80*  GLUCOSE 82 149* 239*   Recent Labs  Lab 02/01/18 0221 02/05/18 1222 02/06/18 0055  HGB 9.9* 10.6* 9.0*  HCT 30.1* 33.0* 27.2*  WBC 2.4* 2.9* 5.8  PLT 127* 105* 93*   Dg Abd 1 View  Result Date: 02/05/2018 CLINICAL DATA:  84 year old male with abdominal distention. EXAM: ABDOMEN - 1 VIEW COMPARISON:  01/31/2018 KUB.  CT Abdomen and Pelvis 10/08/2015. FINDINGS: Portable  AP supine view at 1608 hours. Moderate to large volume of retained stool throughout the colon. Non obstructed bowel gas pattern. Pelvic probably urinary catheter in place. There is probably a small volume of gas within the bladder  explaining gas lucency along the right lower pelvis. No definite pneumoperitoneum on this supine view. No acute osseous abnormality identified. IMPRESSION: 1. Non obstructed bowel gas pattern with large volume of retained stool in the colon. 2. Probable Foley catheter and associated small volume gas in the urinary bladder. Electronically Signed   By: Odessa Fleming M.D.   On: 02/05/2018 16:22   Dg Chest Port 1 View  Result Date: 02/06/2018 CLINICAL DATA:  Shortness of breath EXAM: PORTABLE CHEST 1 VIEW COMPARISON:  February 05, 2018 FINDINGS: The right PICC line terminates near the caval atrial junction. The right-sided infiltrate is stable in the interval. No focal left-sided infiltrate seen on this portable film. Stable cardiomegaly. No other change. IMPRESSION: Stable right-sided infiltrate.  Recommend follow-up to resolution. Electronically Signed   By: Gerome Sam III M.D   On: 02/06/2018 09:12   Dg Chest Port 1 View  Result Date: 02/05/2018 CLINICAL DATA:  84 year old male with abdominal distention and respiratory failure. EXAM: PORTABLE CHEST 1 VIEW COMPARISON:  Portable chest 1222 hours today and earlier. FINDINGS: Portable AP upright view at 1613 hours. Confluent bilateral lower lung and right upper lobe opacity is stable from earlier today. Stable superimposed cardiomegaly and mediastinal contours. No pneumothorax. No moderate or large pleural effusion. No acute osseous abnormality identified. IMPRESSION: 1. Stable right greater than left multilobar airspace opacity from earlier today most suggestive of multilobar pneumonia. 2. No new cardiopulmonary abnormality. Electronically Signed   By: Odessa Fleming M.D.   On: 02/05/2018 16:24   Dg Chest Portable 1 View  Result Date: 02/05/2018 CLINICAL DATA:  Short of breath, respiratory distress EXAM: PORTABLE CHEST 1 VIEW COMPARISON:  Portable chest x-ray of 01/31/2017 FINDINGS: Moderate cardiomegaly again is noted. There is patchy airspace disease bilaterally  right greater than left most consistent with multifocal pneumonia. Small left effusion cannot be excluded. No bony abnormality is seen. IMPRESSION: 1. New patchy airspace disease right greater than left most consistent with multifocal pneumonia. 2. Cardiomegaly.  Possible small effusions. Electronically Signed   By: Dwyane Dee M.D.   On: 02/05/2018 13:10   [place] Ekg Site Rite  Result Date: 02/05/2018 If Site Rite image not attached, placement could not be confirmed due to current cardiac rhythm.   ASSESSMENT / PLAN:  Acute on chronic hypercapnic hypoxic respiratory failure secondary to acute systolic CHF exacerbation  Possible pneumonia Prn Bipap for dyspnea and/or hypoxia  Scheduled and prn bronchodilator therapy  Nebulized steroids  Will give additional dose of 60 mg iv lasix x1 Hold further steroids As no MRSA and concern for aspiration will DC Vanco/cefepime and start on Unasyn 02/06/2018 Follow cultures  Pleuritic chest x-rays  Septic shock possibly secondary to possible HCAP and UTI  Bradycardia questionable etiology TSH okay Mildly elevated troponin likely demand ischemia in setting of respiratory failure  Acute on chronic systolic CHF (Echo 02/01/18 EF 30%-35% with mild to moderate mitral regurgitation and moderate-severe tricuspid regurgitation) Continuous telemetry monitoring  Trend troponin's Only on dobutamine Case discussed with cardiology suggested to taper and DC dobutamine Discontinue midodrine  Hold outpatient antihypertensives  TSH okay  Mental status: As per son this is a new finding - Could be due to Ativan and morphine overnight - As mentation not improving will consider getting  a head CT - Follow patient clinically  Acute on chronic renal failure  Hyponatremia/hyperkalemia -Received Kayexalate Trend BMP  Replace electrolytes as indicated  Monitor UOP Avoid nephrotoxic medications   Abdominal Distension  KUB suggested stool impaction Will start bowel  regimen-Kayexalate ordered Keep NPO for now until off Bipap and able to swallow SUP px: iv pepcid   Chronic anemia no obvious acute blood loss Thrombocytopenia  VTE px: SCD's for now  Trend CBC  Monitor for s/sx of bleeding and transfuse for hgb <7  Type II Diabetes Mellitus  CBG's q4hrs  SSI  - DNR status - PICC line - Follow cardiology consultation   Skin/Wound: chronic changes   Electrolytes: Replace electrolytes per ICU electrolyte replacement protocol.   IVF: none  Nutrition: NPO for now   Prophylaxis: DVT Prophylaxis with Heparin,. GI Prophylaxis.   Restraints: None  PT/OT eval and treat. OOB when appropriate.   Lines/Tubes:  01/1618 foley  none central line.  ADVANCE DIRECTIVE:DNR  FAMILY DISCUSSION:spoke with son  Quality Care: PPI, DVT prophylaxis, HOB elevated, Infection control all reviewed and addressed.  Events and notes from last 24 hours reviewed. Care plan discussed on multidisciplinary rounds  CC TIME:32 min    Old records reviewed discussed results and management plan with patient  Images personally reviewed and results and labs reviewed and discussed with patient.  All medication reviewed and adjusted  Further management depending on test results and work up as outlined above.    Roseanne Reno, M.D

## 2018-02-06 NOTE — Progress Notes (Signed)
Pharmacy Electrolyte Monitoring Consult:  Pharmacy consulted to assist in monitoring and replacing electrolytes in this 84 y.o. male admitted on 02/05/2018 with respiratory failure.   Labs:  Sodium (mmol/L)  Date Value  02/06/2018 136   Potassium (mmol/L)  Date Value  02/06/2018 5.1   Magnesium (mg/dL)  Date Value  16/01/9603 2.5 (H)   Phosphorus (mg/dL)  Date Value  54/12/8117 3.2   Calcium (mg/dL)  Date Value  14/78/2956 8.4 (L)   Albumin (g/dL)  Date Value  21/30/8657 2.8 (L)    Assessment/Plan: Electrolyte goals: Potassium ~4.0 and Magnesium ~2.0  Patient received Calcium gluconate 1 g x 1 and  Kayexelate 30 g x 1 PR ordered. Patient also received furosemide 60 mg IV x 1.  K down to 5. No action needed  Will recheck electrolytes with AM labs   Pharmacy will continue to monitor and adjust per consult.   Olene Floss, Pharm.D, BCPS Clinical Pharmacist 02/06/2018 6:59 PM

## 2018-02-06 NOTE — Progress Notes (Addendum)
[employer] -  at [facility]   PATIENT NAME: Duane Turner    MR#:  [id number]  DATE OF BIRTH:  19-Dec-1932  SUBJECTIVE:   Patient sleepy today. Does not answer questions.  REVIEW OF SYSTEMS:  ROS- unable to obtain  DRUG ALLERGIES:  No Known Allergies VITALS:  Blood pressure (!) 112/54, pulse (!) 106, temperature 99.9 F (37.7 C), resp. rate (!) 30, height 5\' 8"  (1.727 m), weight 81.7 kg, SpO2 100 %. PHYSICAL EXAMINATION:  Physical Exam  GENERAL:  84 y.o.-year-old patient lying in the bed, sleepy EYES: Pupils equal, round, reactive to light and accommodation. No scleral icterus. Extraocular muscles intact.  HEENT: Head atraumatic, normocephalic. Oropharynx and nasopharynx clear.  NECK:  Supple, no jugular venous distention. No thyroid enlargement, no tenderness.  LUNGS: Decreased breath sounds bilaterally, +bibasilar crackles present. No use of accessory muscles of respiration.  CARDIOVASCULAR: S1, S2 normal. No murmurs, rubs, or gallops.  ABDOMEN: Obese, soft, nontender, nondistended. Bowel sounds present. No organomegaly or mass.  EXTREMITIES: Bilateral lower extremity 2+ edema NEUROLOGIC: Awake and responds to verbal commands On BiPAP for respiratory distress PSYCHIATRIC: Not oriented to time place and person SKIN: No obvious rash, lesion, or ulcer.  LABORATORY PANEL:  Male CBC Recent Labs  Lab 02/06/18 0055  WBC 5.8  HGB 9.0*  HCT 27.2*  PLT 93*   ------------------------------------------------------------------------------------------------------------------ Chemistries  Recent Labs  Lab 02/06/18 0055  NA 130*  K 5.5*  CL 98  CO2 23  GLUCOSE 239*  BUN 77*  CREATININE 1.80*  CALCIUM 8.1*  MG 2.5*  AST 30  ALT 42  ALKPHOS 81  BILITOT 0.9   RADIOLOGY:  Ct Head Wo Contrast  Result Date: 02/06/2018 CLINICAL DATA:  Excessive somnolence.  Altered mental status. EXAM: CT HEAD WITHOUT CONTRAST TECHNIQUE: Contiguous axial images  were obtained from the base of the skull through the vertex without intravenous contrast. COMPARISON:  Head CT 09/28/2015 FINDINGS: Brain: There is no mass, hemorrhage or extra-axial collection. The size and configuration of the ventricles and extra-axial CSF spaces are normal. There is an old right basal ganglia lacunar infarct. There is hypoattenuation of the periventricular white matter, most commonly indicating chronic ischemic microangiopathy. Vascular: No abnormal hyperdensity of the major intracranial arteries or dural venous sinuses. No intracranial atherosclerosis. Skull: The visualized skull base, calvarium and extracranial soft tissues are normal. Sinuses/Orbits: No fluid levels or advanced mucosal thickening of the visualized paranasal sinuses. No mastoid or middle ear effusion. The orbits are normal. 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 Electronically Signed   By: Deatra Robinson M.D.   On: 02/06/2018 16:14   Dg Chest Port 1 View  Result Date: 02/06/2018 CLINICAL DATA:  Shortness of breath EXAM: PORTABLE CHEST 1 VIEW COMPARISON:  February 05, 2018 FINDINGS: The right PICC line terminates near the caval atrial junction. The right-sided infiltrate is stable in the interval. No focal left-sided infiltrate seen on this portable film. Stable cardiomegaly. No other change. IMPRESSION: Stable right-sided infiltrate.  Recommend follow-up to resolution. Electronically Signed   By: Gerome Sam III M.D   On: 02/06/2018 09:12   ASSESSMENT AND PLAN:   84 year old male with a known history of chronic systolic heart failure, diabetes mellitus type 2, hyperaldosteronism, hypertension, prostate hypertrophy, bradycardia was referred from [facility] facility for lethargy, respiratory distress and bradycardia.  Acute hypoxic hypercapneic respiratory failure- likely secondary to acute systolic CHF exacerbation and  HCAP -  initially on BiPAP, now on [place] - management per CCM  Sepsis- resolving. possibly due to HCAP/aspiration pneumonia - changed from vanc/cefepime to unasyn today - blood and urine cultures pending  Acute on chronic systolic heart failure- improving. Recent ECHO with EF 30-35%. - continue IV lasix  - cardiology consulted  Altered mental status- patient very sleepy today, may be medication related - CT head pending  Bradycardia- resolved, TSH normal - cardiology consulted - has been on dobutamine, but this has been discontinued  Type 2 diabetes mellitus- blood sugars well controlled - continue SSI   CODE STATUS: DNR  TOTAL TIME TAKING CARE OF THIS PATIENT: 32 minutes.   More than 50% of the time was spent in counseling/coordination of care: YES  POSSIBLE D/C unknown, DEPENDING ON CLINICAL CONDITION.   Jinny Blossom Duane Turner M.D on 02/06/2018 at 5:59 PM  Between 7am to 6pm - Pager - [phone]  After 6pm go to [email or web address] - Scientist, research (life sciences) [facility]  Office  [phone]  CC: Primary care physician; Lauro Regulus, MD  Note: This dictation was prepared with Dragon dictation along with smaller phrase technology. Any transcriptional errors that result from this process are unintentional.

## 2018-02-06 NOTE — Progress Notes (Addendum)
Pharmacy Antibiotic Note  Duane Turner is a 84 y.o. male admitted on 02/05/2018 with sepsis.  Patient admitted from [facility] facility and is requiring BiPAP. Pharmacy has been consulted for Unasyn dosing.  Plan: Discontinue cefepime 2g IV Q12hr per CCM discussion, and initiate Unasyn 3 g q6h due to procalcitonin increase from 4.51 on 10/16 to 8.27 on 10/17. Discussed during CCM rounds.  Height: 5\' 8"  (172.7 cm) Weight: 180 lb 1.9 oz (81.7 kg) IBW/kg (Calculated) : 68.4  Temp (24hrs), Avg:94.7 F (34.8 C), Min:87.6 F (30.9 C), Max:99.9 F (37.7 C)  Recent Labs  Lab 01/31/18 1732 02/01/18 0221 02/02/18 0521 02/05/18 1222 02/05/18 1341 02/06/18 0055  WBC 2.5* 2.4*  --  2.9*  --  5.8  CREATININE 1.45* 1.57* 1.61* 1.51*  --  1.80*  LATICACIDVEN  --   --   --   --  1.4  --     Estimated Creatinine Clearance: 30.6 mL/min (A) (by C-G formula based on SCr of 1.8 mg/dL (H)).    No Known Allergies  Antimicrobials this admission: Unasyn 10/17 >> Cefepime 10/16 >> 10/17 Vancomycin 10/16 >> 10/17  Dose adjustments this admission: N/A  Microbiology results: 10/16 BCx: NG < 1 day 10/16 UCx: pending  10/16 MRSA PCR: pending   Thank you for allowing pharmacy to be a part of this patient's care.   Mauri Reading, PharmD Pharmacy Resident  02/06/2018 12:22 PM

## 2018-02-06 NOTE — Progress Notes (Addendum)
Pharmacy Electrolyte Monitoring Consult:  Pharmacy consulted to assist in monitoring and replacing electrolytes in this 84 y.o. male admitted on 02/05/2018 with respiratory failure.   Labs:  Sodium (mmol/L)  Date Value  02/06/2018 130 (L)   Potassium (mmol/L)  Date Value  02/06/2018 5.5 (H)   Magnesium (mg/dL)  Date Value  29/56/2130 2.5 (H)   Phosphorus (mg/dL)  Date Value  86/57/8469 3.2   Calcium (mg/dL)  Date Value  62/95/2841 8.1 (L)   Albumin (g/dL)  Date Value  32/44/0102 2.8 (L)    Assessment/Plan: Electrolyte goals: Potassium ~4.0 and Magnesium ~2.0  Patient received Calcium gluconate 1 g x 1 and  Kayexelate 30 g x 1 PR ordered. Patient also received furosemide 60 mg IV x 1.  Will recheck electrolytes with evening BMP.   Pharmacy will continue to monitor and adjust per consult.   Mauri Reading, PharmD Pharmacy Resident  02/06/2018 1:14 PM

## 2018-02-06 NOTE — Progress Notes (Signed)
ABG checked off BiPAP doing well - Head CT pending Results for Duane Turner, Duane Turner (MRN [id number]) as of 02/06/2018 15:07  Ref. Range 02/06/2018 14:16  pH, Arterial Latest Ref Range: 7.350 - 7.450  7.39  pCO2 arterial Latest Ref Range: 32.0 - 48.0 mmHg 36  pO2, Arterial Latest Ref Range: 83.0 - 108.0 mmHg 80 (L)  Acid-base deficit Latest Ref Range: 0.0 - 2.0 mmol/L 2.7 (H)  Bicarbonate Latest Ref Range: 20.0 - 28.0 mmol/L 21.8  O2 Saturation Latest Units: % 95.6  Patient temperature Unknown 37.0  Collection site Unknown LEFT RADIAL  Allens test (pass/fail) Latest Ref Range: PASS  PASS    [name] Pulmonary Critical Care & Sleep Medicine

## 2018-02-07 ENCOUNTER — Inpatient Hospital Stay: Payer: Medicare Other

## 2018-02-07 DIAGNOSIS — G9340 Encephalopathy, unspecified: Secondary | ICD-10-CM

## 2018-02-07 DIAGNOSIS — R6521 Severe sepsis with septic shock: Secondary | ICD-10-CM

## 2018-02-07 DIAGNOSIS — A419 Sepsis, unspecified organism: Principal | ICD-10-CM

## 2018-02-07 LAB — COMPREHENSIVE METABOLIC PANEL
ALBUMIN: 2.9 g/dL — AB (ref 3.5–5.0)
ALT: 38 U/L (ref 0–44)
ANION GAP: 9 (ref 5–15)
AST: 29 U/L (ref 15–41)
Alkaline Phosphatase: 81 U/L (ref 38–126)
BUN: 75 mg/dL — ABNORMAL HIGH (ref 8–23)
CHLORIDE: 102 mmol/L (ref 98–111)
CO2: 26 mmol/L (ref 22–32)
Calcium: 8.6 mg/dL — ABNORMAL LOW (ref 8.9–10.3)
Creatinine, Ser: 2.15 mg/dL — ABNORMAL HIGH (ref 0.61–1.24)
GFR, EST AFRICAN AMERICAN: 31 mL/min — AB (ref >60)
GFR, EST NON AFRICAN AMERICAN: 27 mL/min — AB (ref >60)
Glucose, Bld: 128 mg/dL — ABNORMAL HIGH (ref 70–99)
POTASSIUM: 5.1 mmol/L (ref 3.5–5.1)
Sodium: 137 mmol/L (ref 135–145)
Total Bilirubin: 1.2 mg/dL (ref 0.3–1.2)
Total Protein: 6.4 g/dL — ABNORMAL LOW (ref 6.5–8.1)

## 2018-02-07 LAB — BASIC METABOLIC PANEL
ANION GAP: 8 (ref 5–15)
BUN: 86 mg/dL — ABNORMAL HIGH (ref 8–23)
CHLORIDE: 105 mmol/L (ref 98–111)
CO2: 24 mmol/L (ref 22–32)
Calcium: 8.6 mg/dL — ABNORMAL LOW (ref 8.9–10.3)
Creatinine, Ser: 2.23 mg/dL — ABNORMAL HIGH (ref 0.61–1.24)
GFR calc Af Amer: 30 mL/min — ABNORMAL LOW (ref >60)
GFR, EST NON AFRICAN AMERICAN: 26 mL/min — AB (ref >60)
Glucose, Bld: 120 mg/dL — ABNORMAL HIGH (ref 70–99)
POTASSIUM: 5.6 mmol/L — AB (ref 3.5–5.1)
Sodium: 137 mmol/L (ref 135–145)

## 2018-02-07 LAB — BLOOD GAS, ARTERIAL
Acid-base deficit: 2.5 mmol/L — ABNORMAL HIGH (ref 0.0–2.0)
Bicarbonate: 23.7 mmol/L (ref 20.0–28.0)
FIO2: 0.28
O2 Saturation: 93.1 %
PCO2 ART: 46 mmHg (ref 32.0–48.0)
PO2 ART: 73 mmHg — AB (ref 83.0–108.0)
Patient temperature: 37
pH, Arterial: 7.32 — ABNORMAL LOW (ref 7.350–7.450)

## 2018-02-07 LAB — CBC WITH DIFFERENTIAL/PLATELET
ABS IMMATURE GRANULOCYTES: 0.4 10*3/uL — AB (ref 0.00–0.07)
BASOS ABS: 0 10*3/uL (ref 0.0–0.1)
BASOS PCT: 0 %
EOS ABS: 0.1 10*3/uL (ref 0.0–0.5)
Eosinophils Relative: 1 %
HEMATOCRIT: 28.1 % — AB (ref 39.0–52.0)
Hemoglobin: 9.4 g/dL — ABNORMAL LOW (ref 13.0–17.0)
IMMATURE GRANULOCYTES: 3 %
LYMPHS ABS: 0.5 10*3/uL — AB (ref 0.7–4.0)
Lymphocytes Relative: 4 %
MCH: 30.3 pg (ref 26.0–34.0)
MCHC: 33.5 g/dL (ref 30.0–36.0)
MCV: 90.6 fL (ref 80.0–100.0)
MONOS PCT: 9 %
Monocytes Absolute: 1.2 10*3/uL — ABNORMAL HIGH (ref 0.1–1.0)
NEUTROS ABS: 11 10*3/uL — AB (ref 1.7–7.7)
NEUTROS PCT: 83 %
NRBC: 0.4 % — AB (ref 0.0–0.2)
PLATELETS: 93 10*3/uL — AB (ref 150–400)
RBC: 3.1 MIL/uL — ABNORMAL LOW (ref 4.22–5.81)
RDW: 15.8 % — AB (ref 11.5–15.5)
WBC: 13.2 10*3/uL — ABNORMAL HIGH (ref 4.0–10.5)

## 2018-02-07 LAB — GLUCOSE, CAPILLARY
GLUCOSE-CAPILLARY: 118 mg/dL — AB (ref 70–99)
GLUCOSE-CAPILLARY: 136 mg/dL — AB (ref 70–99)
GLUCOSE-CAPILLARY: 136 mg/dL — AB (ref 70–99)
GLUCOSE-CAPILLARY: 108 mg/dL — AB (ref 70–99)
Glucose-Capillary: 118 mg/dL — ABNORMAL HIGH (ref 70–99)

## 2018-02-07 LAB — MAGNESIUM: MAGNESIUM: 2.5 mg/dL — AB (ref 1.7–2.4)

## 2018-02-07 LAB — PROCALCITONIN: Procalcitonin: 5.87 ng/mL

## 2018-02-07 LAB — URINE CULTURE: Culture: NO GROWTH

## 2018-02-07 LAB — AMMONIA: AMMONIA: 22 umol/L (ref 9–35)

## 2018-02-07 LAB — PHOSPHORUS: PHOSPHORUS: 3.9 mg/dL (ref 2.5–4.6)

## 2018-02-07 MED ORDER — SODIUM CHLORIDE 0.9 % IV SOLN
3.0000 g | Freq: Two times a day (BID) | INTRAVENOUS | Status: DC
  Filled 2018-02-07: qty 3

## 2018-02-07 MED ORDER — FUROSEMIDE 10 MG/ML IJ SOLN
20.0000 mg | Freq: Once | INTRAMUSCULAR | Status: AC
  Administered 2018-02-07: 20 mg via INTRAVENOUS
  Filled 2018-02-07: qty 2

## 2018-02-07 MED ORDER — FAMOTIDINE IN NACL 20-0.9 MG/50ML-% IV SOLN
20.0000 mg | INTRAVENOUS | Status: DC
  Administered 2018-02-07 – 2018-02-08 (×2): 20 mg via INTRAVENOUS
  Filled 2018-02-07 (×3): qty 50

## 2018-02-07 MED ORDER — SODIUM CHLORIDE 0.9 % IV SOLN
3.0000 g | Freq: Four times a day (QID) | INTRAVENOUS | Status: DC
  Administered 2018-02-07 – 2018-02-08 (×4): 3 g via INTRAVENOUS
  Filled 2018-02-07 (×7): qty 3

## 2018-02-07 NOTE — Consult Note (Signed)
Name: Duane Turner MRN: [id number] DOB: 1933/01/25    ADMISSION DATE:  02/05/2018 CONSULTATION DATE: 02/05/2018  REFERRING MD : Dr. Tobi Bastos   CHIEF COMPLAINT: Respiratory Distress   BRIEF PATIENT DESCRIPTION:  84 yo male admitted with sepsis possibly secondary to pneumonia and UTI, bradycardia, and acute on chronic hypoxic hypercapnic respiratory failure secondary to acute on chronic CHF exacerbation and possible HCAP requiring Bipap and dopamine gtt   SIGNIFICANT EVENTS/STUDIES:  10/16-Pt admitted to stepdown unit on Bipap 1017: CT head negative, cardiology has been consulted since admission awaiting note, spoke with Dr. Rolland Porter awaiting note 10/18: Doing well however mentation is not getting better, EEG/ammonia level/neurologist consult called  HISTORY OF PRESENT ILLNESS:   This is an 84 yo male with a PMH of Chronic systolic CHF, BPH, Arthritis, Anemia, HTN, Hyperaldosteronism, HTN, Diabetes Mellitus, Chronic Kidney Disease, and Anemia.  He presented to [facility] ER on 10/16 from [facility] accompanied by the nurse practitioner from the facility in severe respiratory distress and bradycardia.  Per ER notes the pt has had a 40 pound weight gain over the past 2 weeks.  He was recently admitted to [facility] with bradycardia hr 30-50's on 02/01/18 per Cardiologist Dr. Herbie Baltimore no indication for pacemaker at that time thought possibly secondary to hyperkalemia. Echo results 02/01/18 revealed EF 30% to 35% with mild to moderate mitral regurgitation and moderate to severe tricuspid regurgitation.  During current presentation CXR revealed pulmonary edema and possible pneumonia. He was hypotensive with sbp 80' and hypothermic with temp 87.6 ruling pt in for sepsis. Therefore, he received iv abx and 40 mg iv lasix. Initial EKG revealed sinus bradycardia hr 45, first degree AV block, prolonged QTC.  He received 1 mg atropine and dopamine gtt initiated.  He was subsequently admitted to the stepdown  unit by hospitalist team for additional workup and treatment.  SUBJECTIVE:  Currently on nasal cannula doing well -Overnight had episodes of apnea which required on and off BiPAP - ABG is acceptable pH of 7.32 - No other significant complaint noted  VITAL SIGNS: Temp:  [96.4 F (35.8 C)-99 F (37.2 C)] 98.4 F (36.9 C) (10/18 1200) Pulse Rate:  [71-106] 80 (10/18 1200) Resp:  [11-36] 11 (10/18 0930) BP: (74-247)/(53-202) 94/62 (10/18 1200) SpO2:  [85 %-100 %] 98 % (10/18 1451) FiO2 (%):  [28 %] 28 % (10/18 0206)  PHYSICAL EXAMINATION: General: acutely ill appearing elderly male in severe respiratory distress on Bipap Neuro: lethargic, but more awake than yesterday HEENT: JVD present  Cardiovascular: nsr, no R/G Lungs: Improvement in rales, still some rales noted on right lower lobe, tachypneic, and labored  Abdomen: hypoactive BS x4, obese, distended, and firm Musculoskeletal: 2+ generalized edema  Skin: chronic vascular discoloration bilateral lower extremities   Recent Labs  Lab 02/06/18 0055 02/06/18 1635 02/07/18 0407  NA 130* 136 137  K 5.5* 5.1 5.1  CL 98 102 102  CO2 23 25 26   BUN 77* 77* 75*  CREATININE 1.80* 2.07* 2.15*  GLUCOSE 239* 102* 128*   Recent Labs  Lab 02/05/18 1222 02/06/18 0055 02/07/18 0407  HGB 10.6* 9.0* 9.4*  HCT 33.0* 27.2* 28.1*  WBC 2.9* 5.8 13.2*  PLT 105* 93* 93*   Dg Abd 1 View  Result Date: 02/05/2018 CLINICAL DATA:  84 year old male with abdominal distention. EXAM: ABDOMEN - 1 VIEW COMPARISON:  01/31/2018 KUB.  CT Abdomen and Pelvis 10/08/2015. FINDINGS: Portable AP supine view at 1608 hours. Moderate to large volume of retained stool throughout  the colon. Non obstructed bowel gas pattern. Pelvic probably urinary catheter in place. There is probably a small volume of gas within the bladder explaining gas lucency along the right lower pelvis. No definite pneumoperitoneum on this supine view. No acute osseous abnormality  identified. IMPRESSION: 1. Non obstructed bowel gas pattern with large volume of retained stool in the colon. 2. Probable Foley catheter and associated small volume gas in the urinary bladder. Electronically Signed   By: Odessa Fleming M.D.   On: 02/05/2018 16:22   Ct Head Wo Contrast  Result Date: 02/06/2018 CLINICAL DATA:  Excessive somnolence.  Altered mental status. EXAM: CT HEAD WITHOUT CONTRAST TECHNIQUE: Contiguous axial images were obtained from the base of the skull through the vertex without intravenous contrast. COMPARISON:  Head CT 09/28/2015 FINDINGS: Brain: There is no mass, hemorrhage or extra-axial collection. The size and configuration of the ventricles and extra-axial CSF spaces are normal. There is an old right basal ganglia lacunar infarct. There is hypoattenuation of the periventricular white matter, most commonly indicating chronic ischemic microangiopathy. Vascular: No abnormal hyperdensity of the major intracranial arteries or dural venous sinuses. No intracranial atherosclerosis. Skull: The visualized skull base, calvarium and extracranial soft tissues are normal. Sinuses/Orbits: No fluid levels or advanced mucosal thickening of the visualized paranasal sinuses. No mastoid or middle ear effusion. The orbits are normal. 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 Electronically Signed   By: Deatra Robinson M.D.   On: 02/06/2018 16:14   Dg Chest Port 1 View  Result Date: 02/07/2018 CLINICAL DATA:  Shortness of breath EXAM: PORTABLE CHEST 1 VIEW COMPARISON:  02/06/2018 FINDINGS: Cardiac shadow is enlarged but stable. Right-sided PICC line is again noted in satisfactory position. Lungs are well aerated with the exception of patchy infiltrate in the right mid and lower lung stable from the prior exam. No new focal abnormality is noted. IMPRESSION: Stable right sided infiltrate.  Continued follow-up is recommended.  Electronically Signed   By: Alcide Clever M.D.   On: 02/07/2018 07:02   Dg Chest Port 1 View  Result Date: 02/06/2018 CLINICAL DATA:  Shortness of breath EXAM: PORTABLE CHEST 1 VIEW COMPARISON:  February 05, 2018 FINDINGS: The right PICC line terminates near the caval atrial junction. The right-sided infiltrate is stable in the interval. No focal left-sided infiltrate seen on this portable film. Stable cardiomegaly. No other change. IMPRESSION: Stable right-sided infiltrate.  Recommend follow-up to resolution. Electronically Signed   By: Gerome Sam III M.D   On: 02/06/2018 09:12   Dg Chest Port 1 View  Result Date: 02/05/2018 CLINICAL DATA:  84 year old male with abdominal distention and respiratory failure. EXAM: PORTABLE CHEST 1 VIEW COMPARISON:  Portable chest 1222 hours today and earlier. FINDINGS: Portable AP upright view at 1613 hours. Confluent bilateral lower lung and right upper lobe opacity is stable from earlier today. Stable superimposed cardiomegaly and mediastinal contours. No pneumothorax. No moderate or large pleural effusion. No acute osseous abnormality identified. IMPRESSION: 1. Stable right greater than left multilobar airspace opacity from earlier today most suggestive of multilobar pneumonia. 2. No new cardiopulmonary abnormality. Electronically Signed   By: Odessa Fleming M.D.   On: 02/05/2018 16:24   [place] Ekg Site Rite  Result Date: 02/05/2018 If Site Rite image not attached, placement could not be confirmed due to current cardiac rhythm.   ASSESSMENT / PLAN:  Acute on chronic hypercapnic hypoxic respiratory failure secondary to acute systolic CHF exacerbation  Possible  pneumonia Prn Bipap for dyspnea and/or hypoxia  Scheduled and prn bronchodilator therapy  Nebulized steroids  Will give Lasix 20 today and monitor Vanco and cefepime was stopped currently on Unasyn for possible aspiration on the right side Trend WBC and monitor fever curve  Trend PCT  Follow cultures    Repeat CXR in am   Altered mental status: Questionable etiology as per son patient was completely fine before he came here - Might be related to sepsis head CT is okay -Ammonia level is okay - We will get EEG and neurology evaluation -Monitor closely no evidence of any seizures  Septic shock possibly secondary to possible HCAP and UTI  Bradycardia  Mildly elevated troponin likely demand ischemia in setting of respiratory failure  Acute on chronic systolic CHF (Echo 02/01/18 EF 30%-35% with mild to moderate mitral regurgitation and moderate-severe tricuspid regurgitation) Continuous telemetry monitoring  DC dobutamine as recommended by cardiology Patient also found to have bradycardia-awaiting cardiology input on that Patient also found to have hypothermia and altered mental status Discontinue midodrine as it can cause bradycardia Hold outpatient antihypertensives due to borderline blood pressure TSH normal  Hypothermia, bradycardia, low blood pressure all can be part of adrenal insufficiency-we will send serum cortisol level if it is low consider treating with steroids  Acute on chronic renal failure  Hyponatremia Trend BMP  Replace electrolytes as indicated  Monitor UOP Avoid nephrotoxic medications   Abdominal Distension-abdominal x-rays okay Will start bowel regimen  Keep NPO for now until mentation improves SUP px: iv pepcid   Chronic anemia no obvious acute blood loss Thrombocytopenia  VTE px: SCD's for now  Trend CBC  Monitor for s/sx of bleeding and transfuse for hgb <7  Type II Diabetes Mellitus  CBG's q4hrs  SSI   -I spoke with pts son Kaleem Sartwell to discuss plan of care and pt is high risk for mechanical intubation.  However, Quinnten Calvin stated he did not want his father to be mechanically intubated and wanted to change his code status to DO NOT RESUSCITATE.  He also informed me if his father did not improve with medical management he wanted to  transition his father to hospice with comfort measures only.  Therefore, pts code status changed to DO NOT RESUSCITATE per pts son request (the pt is unable to participate in plan of care he is currently on Bipap in severe respiratory distress and lethargic).   Skin/Wound: chronic changes   Electrolytes: Replace electrolytes per ICU electrolyte replacement protocol.   IVF: none  Nutrition: NPO for now   Prophylaxis: DVT Prophylaxis with Heparin,. GI Prophylaxis.   Restraints: None  PT/OT eval and treat. OOB when appropriate.   Lines/Tubes:  01/1618 foley  none central line.  ADVANCE DIRECTIVE:DNR  FAMILY DISCUSSION:spoke with son  Quality Care: PPI, DVT prophylaxis, HOB elevated, Infection control all reviewed and addressed.  Events and notes from last 24 hours reviewed. Care plan discussed on multidisciplinary rounds  CC TIME:32 min    Old records reviewed discussed results and management plan with patient  Images personally reviewed and results and labs reviewed and discussed with patient.  All medication reviewed and adjusted  Further management depending on test results and work up as outlined above.   Continue to monitor as a stepdown patient Roseanne Reno, M.D

## 2018-02-07 NOTE — Consult Note (Addendum)
Reason for Consult: Altered mental status Referring Physician:Shah, Rutul, MD   CC: Altered mental status  HPI: Duane Turner is an 84 y.o. male with significant history of CHF with EF 30 to 35%, anemia, hypertension, diabetes mellitus, chronic kidney disease, prior stroke with chronic left sided weakness, BPH, presenting to the ED on 02/05/2018 with chief complaint of shortness of breath noted to be in respiratory distress.  Patient is lethargic and unable to provide history so history mostly obtained from ED and critical care notes.  Per chart documentation, patient arrived accompanied by a nurse practitioner from the nursing home where he resides.  He was on 4 L of oxygen via nasal cannula and satting in the low 90s so  EMS placed him on CPAP.  He arrived to the ED on CPAP with oxygen saturation between 95 to 97%, he was bradycardic, with blood pressure of 110/83 and a blood glucose of 133.  Per ED reports, nurse practitioner reported that patient had gained about 40 pounds in the past 2 weeks.  He was previously admitted to [facility] for symptomatic bradycardia and chest pain which had improved overnight, per cardiology report sinus bradycardia was likely due to hyperkalemia, therefore there was no indication for pacemaker placement at that time.  Initial work-up in the ED during current presentation included a chest x-ray which showed pulmonary edema and possible pneumonia.  He was therefore started on IV antibiotics and given 40 mg IV Lasix.  EKG showed bradycardia with first-degree AV block and prolonged QTC , he was treated with 1 mg of atropine and dopamine drip initiated. Patient was subsequently transferred to the stepdown unit for close monitoring.  During the course of this admission he was noted to be hypothermic and in hypoxic hypercapnic respiratory failure and was therefore placed on BiPAP.  However he is currently not on BiPAP per son's wishes who stated that he would not like his father to be  intubated and made him a DNR. Per ICU report patient received morphine and Ativan last night for respiratory distress and agitation, staff noticed this morning that he was confused with worsening mental status.  Neurology was therefore consulted for further evaluation.  Past Medical History:  Diagnosis Date  . Anemia   . Arthritis   . BPH (benign prostatic hyperplasia)   . Chronic systolic heart failure (HCC)   . Diabetes mellitus without complication (HCC)   . Dysphagia   . Hyperaldosteronism (HCC)   . Hypertension   . Renal disorder     History reviewed. No pertinent surgical history.  No family history on file.  Unable to obtain from patient none available on file  Social History:  reports that he has never smoked. He has never used smokeless tobacco. He reports that he drank alcohol. He reports that he has current or past drug history.  No Known Allergies  Medications:  I have reviewed the patient's current medications. Prior to Admission:  Medications Prior to Admission  Medication Sig Dispense Refill Last Dose  . acetaminophen (TYLENOL) 325 MG tablet Take 650 mg by mouth every 4 (four) hours as needed for mild pain or moderate pain.   unknown at unknown  . aspirin (ASPIRIN EC) 81 MG EC tablet Take 81 mg by mouth daily.     unknown at unknown  . insulin NPH-regular Human (NOVOLIN 70/30) (70-30) 100 UNIT/ML injection Inject 10 Units into the skin daily.   unknown at unknown  . magnesium oxide (MAG-OX) 400 MG tablet Take 400  mg by mouth daily.   unknown at unknown  . midodrine (PROAMATINE) 10 MG tablet Take 10 mg by mouth 3 (three) times daily with meals.   unknown at unknown  . multivitamin (ONE-A-DAY MEN'S) TABS tablet Take 1 tablet by mouth daily.   unknown at unknown  . potassium chloride (K-DUR,KLOR-CON) 10 MEQ tablet Take 20 mEq by mouth daily.   unknown at unknown  . pravastatin (PRAVACHOL) 40 MG tablet Take 40 mg by mouth at bedtime.     unknown at unknown  . Skin  Protectants, Misc. (EUCERIN) cream Apply 1 application topically daily as needed for dry skin. Apply to bilateral lower extremities   unknown at unknown  . torsemide (DEMADEX) 20 MG tablet Take 40 mg by mouth 2 (two) times daily.   unknown at unknown  . Vitamin D, Ergocalciferol, (DRISDOL) 50000 units CAPS capsule Take 50,000 Units by mouth every 7 (seven) days.   unknown at unknown  . insulin aspart (NOVOLOG) 100 UNIT/ML injection Inject 0-5 Units into the skin at bedtime. CBG < 70: implement hypoglycemia protocol  CBG 70 - 120: 0 units  CBG 121 - 150: 0 units  CBG 151 - 200: 0 units  CBG 201 - 250: 2 units  CBG 251 - 300: 3 units  CBG 301 - 350: 4 units  CBG 351 - 400: 5 units  CBG > 400 call MD (Patient not taking: Reported on 02/05/2018) 10 mL 11 Not Taking at Unknown time  . insulin aspart (NOVOLOG) 100 UNIT/ML injection Inject 0-9 Units into the skin 3 (three) times daily with meals. CBG < 70: implement hypoglycemia protocol  CBG 70 - 120: 0 units  CBG 121 - 150: 1 unit  CBG 151 - 200: 2 units  CBG 201 - 250: 3 units  CBG 251 - 300: 5 units  CBG 301 - 350: 7 units  CBG 351 - 400 9 units  CBG > 400 call MD (Patient not taking: Reported on 02/05/2018) 10 mL 11 Not Taking at Unknown time  . lisinopril (PRINIVIL,ZESTRIL) 10 MG tablet Take 1 tablet (10 mg total) by mouth at bedtime. For blood pressure (Patient not taking: Reported on 02/05/2018) 30 tablet 0 Not Taking at Unknown time   Scheduled: . aspirin EC  81 mg Oral Daily  . budesonide (PULMICORT) nebulizer solution  0.5 mg Nebulization BID  . chlorhexidine  15 mL Mouth Rinse BID  . heparin injection (subcutaneous)  5,000 Units Subcutaneous Q8H  . insulin aspart  0-9 Units Subcutaneous Q4H  . ipratropium-albuterol  3 mL Nebulization Q6H  . mouth rinse  15 mL Mouth Rinse q12n4p  . OLANZapine  5 mg Oral QHS  . pravastatin  40 mg Oral QHS  . sodium chloride flush  10-40 mL Intracatheter Q12H  . sodium chloride flush  3 mL  Intravenous Q12H    ROS: Unable to obtain from patient due to current altered mental status  Physical Examination: Blood pressure 105/75, pulse 75, temperature (!) 96.6 F (35.9 C), resp. rate (!) 23, height 5\' 8"  (1.727 m), weight 81.7 kg, SpO2 97 %.  HEENT-  Normocephalic, no lesions, without obvious abnormality.  Normal external eye and conjunctiva.  Normal TM's bilaterally.  Normal auditory canals and external ears. Normal external nose, mucus membranes and septum.  Normal pharynx. Cardiovascular- S1, S2 normal, pulses palpable throughout   Lungs- chest clear, no wheezing, rales, normal symmetric air entry Abdomen- soft, non-tender; bowel sounds normal; no masses,  no organomegaly Extremities- no  edema Lymph-no adenopathy palpable Musculoskeletal-no joint tenderness, deformity or swelling Skin-warm and dry, no hyperpigmentation, vitiligo, or suspicious lesions  Neurological Exam   Mental Status: Appears drowsy and short of breath but able to open eyes to verbal stimuli.  Speech slow and inaudible.  Able to follow 1 step commands with some difficulty.  Able to wiggle toes and stick tongue out on command. Drifts easily to sleep Cranial Nerves: II: Discs flat bilaterally; Visual fields intact to threat bilaterally, pupils equal, round, reactive to light and accommodation III,IV, VI: ptosis not present, extra-ocular motions intact bilaterally V,VII: Left facial asymmetry, facial light touch sensation intact painful stimuli VIII: Responds to sound IX,X: gag reflex deferred XI: Unable to assess XII: midline tongue extension Motor: Lifts both arms against gravity but does not lift legs off the bed Tone and bulk:normal tone throughout; no atrophy noted Sensory: Responds to painful stimuli over the right better than the left Deep Tendon Reflexes: 1+ with absent AJ's bilaterally Plantars: Right: upgoing                              Left: upgoing Cerebellar: Unable to assess Gait:  not tested due to safety concerns  Data Reviewed  Laboratory Studies:   Basic Metabolic Panel: Recent Labs  Lab 02/02/18 0521 02/05/18 1222 02/06/18 0055 02/06/18 1635 02/07/18 0407  NA 133* 132* 130* 136 137  K 4.7 4.4 5.5* 5.1 5.1  CL 103 102 98 102 102  CO2 24 22 23 25 26   GLUCOSE 82 149* 239* 102* 128*  BUN 81* 74* 77* 77* 75*  CREATININE 1.61* 1.51* 1.80* 2.07* 2.15*  CALCIUM 8.3* 8.4* 8.1* 8.4* 8.6*  MG  --  2.7* 2.5*  --  2.5*  PHOS  --   --  3.2  --  3.9    Liver Function Tests: Recent Labs  Lab 02/05/18 1222 02/06/18 0055 02/07/18 0407  AST 32 30 29  ALT 51* 42 38  ALKPHOS 95 81 81  BILITOT 0.7 0.9 1.2  PROT 6.9 6.5 6.4*  ALBUMIN 3.2* 2.8* 2.9*   No results for input(s): LIPASE, AMYLASE in the last 168 hours. No results for input(s): AMMONIA in the last 168 hours.  CBC: Recent Labs  Lab 01/31/18 1732 02/01/18 0221 02/05/18 1222 02/06/18 0055 02/07/18 0407  WBC 2.5* 2.4* 2.9* 5.8 13.2*  NEUTROABS  --   --   --  4.9 11.0*  HGB 10.2* 9.9* 10.6* 9.0* 9.4*  HCT 30.6* 30.1* 33.0* 27.2* 28.1*  MCV 91.1 90.4 91.7 91.3 90.6  PLT 137* 127* 105* 93* 93*    Cardiac Enzymes: Recent Labs  Lab 02/05/18 1222 02/05/18 1522 02/05/18 1957 02/06/18 0055 02/06/18 0130  TROPONINI 0.03* 0.04* 0.04* 0.25* 0.11*    BNP: Invalid input(s): POCBNP  CBG: Recent Labs  Lab 02/06/18 2017 02/06/18 2153 02/06/18 2334 02/07/18 0341 02/07/18 0729  GLUCAP 110* 113* 118* 118* 136*    Microbiology: Results for orders placed or performed during the hospital encounter of 02/05/18  Urine culture     Status: None   Collection Time: 02/05/18  1:16 PM  Result Value Ref Range Status   Specimen Description   Final    URINE, RANDOM Performed at [facility], [address], [place], [place] [address]    Special Requests   Final    NONE Performed at [facility], [address], [place], [place] [address]    Culture  Final    NO  GROWTH Performed at [facility], [address], [place], [place] [address]    Report Status 02/07/2018 FINAL  Final  Blood Culture (routine x 2)     Status: None (Preliminary result)   Collection Time: 02/05/18  1:41 PM  Result Value Ref Range Status   Specimen Description BLOOD LEFT HAND  Final   Special Requests   Final    BOTTLES DRAWN AEROBIC AND ANAEROBIC Blood Culture adequate volume   Culture   Final    NO GROWTH 2 DAYS Performed at [facility], [address] [place], [place] [address]    Report Status PENDING  Incomplete  Blood Culture (routine x 2)     Status: None (Preliminary result)   Collection Time: 02/05/18  1:41 PM  Result Value Ref Range Status   Specimen Description BLOOD RIGHT ARM  Final   Special Requests   Final    BOTTLES DRAWN AEROBIC AND ANAEROBIC Blood Culture adequate volume   Culture   Final    NO GROWTH 2 DAYS Performed at [facility], [address] [place], [place] [address]    Report Status PENDING  Incomplete    Coagulation Studies: Recent Labs    02/06/18 0555  LABPROT 20.2*  INR 1.75    Urinalysis:  Recent Labs  Lab 02/05/18 1316  COLORURINE YELLOW*  LABSPEC 1.008  PHURINE 5.0  GLUCOSEU NEGATIVE  HGBUR NEGATIVE  BILIRUBINUR NEGATIVE  KETONESUR NEGATIVE  PROTEINUR NEGATIVE  NITRITE NEGATIVE  LEUKOCYTESUR TRACE*    Lipid Panel:     Component Value Date/Time   CHOL 162 11/11/2006 2249   TRIG 53 11/11/2006 2249   HDL 55 11/11/2006 2249   CHOLHDL 2.9 Ratio 11/11/2006 2249   VLDL 11 11/11/2006 2249   LDLCALC 96 11/11/2006 2249    HgbA1C:  Lab Results  Component Value Date   HGBA1C 6.6 (H) 01/31/2018    Urine Drug Screen:  No results found for: LABOPIA, COCAINSCRNUR, LABBENZ, AMPHETMU, THCU, LABBARB  Alcohol Level: No results for input(s): ETH in the last 168 hours.  Other results: EKG: sinus bradycardia, 1st degree AV block.  Imaging: Dg Abd 1 View  Result Date:  02/05/2018 CLINICAL DATA:  84 year old male with abdominal distention. EXAM: ABDOMEN - 1 VIEW COMPARISON:  01/31/2018 KUB.  CT Abdomen and Pelvis 10/08/2015. FINDINGS: Portable AP supine view at 1608 hours. Moderate to large volume of retained stool throughout the colon. Non obstructed bowel gas pattern. Pelvic probably urinary catheter in place. There is probably a small volume of gas within the bladder explaining gas lucency along the right lower pelvis. No definite pneumoperitoneum on this supine view. No acute osseous abnormality identified. IMPRESSION: 1. Non obstructed bowel gas pattern with large volume of retained stool in the colon. 2. Probable Foley catheter and associated small volume gas in the urinary bladder. Electronically Signed   By: Odessa Fleming M.D.   On: 02/05/2018 16:22   Ct Head Wo Contrast  Result Date: 02/06/2018 CLINICAL DATA:  Excessive somnolence.  Altered mental status. EXAM: CT HEAD WITHOUT CONTRAST TECHNIQUE: Contiguous axial images were obtained from the base of the skull through the vertex without intravenous contrast. COMPARISON:  Head CT 09/28/2015 FINDINGS: Brain: There is no mass, hemorrhage or extra-axial collection. The size and configuration of the ventricles and extra-axial CSF spaces are normal. There is an old right basal ganglia lacunar infarct. There is hypoattenuation of the periventricular white matter, most commonly indicating chronic  ischemic microangiopathy. Vascular: No abnormal hyperdensity of the major intracranial arteries or dural venous sinuses. No intracranial atherosclerosis. Skull: The visualized skull base, calvarium and extracranial soft tissues are normal. Sinuses/Orbits: No fluid levels or advanced mucosal thickening of the visualized paranasal sinuses. No mastoid or middle ear effusion. The orbits are normal. 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 Electronically Signed    By: Deatra Robinson M.D.   On: 02/06/2018 16:14   Dg Chest Port 1 View  Result Date: 02/07/2018 CLINICAL DATA:  Shortness of breath EXAM: PORTABLE CHEST 1 VIEW COMPARISON:  02/06/2018 FINDINGS: Cardiac shadow is enlarged but stable. Right-sided PICC line is again noted in satisfactory position. Lungs are well aerated with the exception of patchy infiltrate in the right mid and lower lung stable from the prior exam. No new focal abnormality is noted. IMPRESSION: Stable right sided infiltrate.  Continued follow-up is recommended. Electronically Signed   By: Alcide Clever M.D.   On: 02/07/2018 07:02   Dg Chest Port 1 View  Result Date: 02/06/2018 CLINICAL DATA:  Shortness of breath EXAM: PORTABLE CHEST 1 VIEW COMPARISON:  February 05, 2018 FINDINGS: The right PICC line terminates near the caval atrial junction. The right-sided infiltrate is stable in the interval. No focal left-sided infiltrate seen on this portable film. Stable cardiomegaly. No other change. IMPRESSION: Stable right-sided infiltrate.  Recommend follow-up to resolution. Electronically Signed   By: Gerome Sam III M.D   On: 02/06/2018 09:12   Dg Chest Port 1 View  Result Date: 02/05/2018 CLINICAL DATA:  84 year old male with abdominal distention and respiratory failure. EXAM: PORTABLE CHEST 1 VIEW COMPARISON:  Portable chest 1222 hours today and earlier. FINDINGS: Portable AP upright view at 1613 hours. Confluent bilateral lower lung and right upper lobe opacity is stable from earlier today. Stable superimposed cardiomegaly and mediastinal contours. No pneumothorax. No moderate or large pleural effusion. No acute osseous abnormality identified. IMPRESSION: 1. Stable right greater than left multilobar airspace opacity from earlier today most suggestive of multilobar pneumonia. 2. No new cardiopulmonary abnormality. Electronically Signed   By: Odessa Fleming M.D.   On: 02/05/2018 16:24   Dg Chest Portable 1 View  Result Date:  02/05/2018 CLINICAL DATA:  Short of breath, respiratory distress EXAM: PORTABLE CHEST 1 VIEW COMPARISON:  Portable chest x-ray of 01/31/2017 FINDINGS: Moderate cardiomegaly again is noted. There is patchy airspace disease bilaterally right greater than left most consistent with multifocal pneumonia. Small left effusion cannot be excluded. No bony abnormality is seen. IMPRESSION: 1. New patchy airspace disease right greater than left most consistent with multifocal pneumonia. 2. Cardiomegaly.  Possible small effusions. Electronically Signed   By: Dwyane Dee M.D.   On: 02/05/2018 13:10   [place] Ekg Site Rite  Result Date: 02/05/2018 If Site Rite image not attached, placement could not be confirmed due to current cardiac rhythm.  Patient seen and examined.  Clinical course and management discussed.  Necessary edits performed.  I agree with the above.  Assessment and plan of care developed and discussed below.     Assessment: 84 year old male with significant history of CHF with EF 30 to 35%, anemia, hypertension, diabetes mellitus, chronic kidney disease, prior stroke with chronic left sided weakness, BPH, presenting with shortness of breath found to be in acute respiratory failure secondary to CHF exacerbation, and septic due to pneumonia and UTI.  Now with altered mental status.  Etiology unclear.  Although patient with multiple medical  issues (infection, respiratory failure, etc.) that may be contributing, also can not rule out the possibility of stroke related to hypoperfusion.  CT head reviewed and showed no acute intracranial abnormality, old right caudate lacunar infarct noted.  Due to chronic baseline abnormalities may require prolonged period of time to recover from current medical issues.    Plan 1. MRI of the brain without contrast when medically stable 2. Agree with continuing to address medical issues.   3. EEG  This patient was staffed with Dr. Verlon Au, Thad Ranger who personally evaluated  patient, reviewed documentation and agreed with assessment and plan of care as above.  Webb Silversmith, DNP, FNP-BC Board certified Nurse Practitioner Neurology Department  02/07/2018, 11:37 AM   Thana Farr, MD Neurology [phone]  02/07/2018  4:36 PM

## 2018-02-07 NOTE — Progress Notes (Signed)
Pharmacy Electrolyte Monitoring Consult:  Pharmacy consulted to assist in monitoring and replacing electrolytes in this 84 y.o. male admitted on 02/05/2018 with respiratory failure.   Labs:  Sodium (mmol/L)  Date Value  02/07/2018 137   Potassium (mmol/L)  Date Value  02/07/2018 5.1   Magnesium (mg/dL)  Date Value  16/01/9603 2.5 (H)   Phosphorus (mg/dL)  Date Value  54/12/8117 3.9   Calcium (mg/dL)  Date Value  14/78/2956 8.6 (L)   Albumin (g/dL)  Date Value  21/30/8657 2.9 (L)    Assessment/Plan: Electrolyte goals: Potassium ~4.0 and Magnesium ~2.0  No need for electrolyte replacement.  Will recheck electrolytes with AM BMP.   Pharmacy will continue to monitor and adjust per consult.   Mauri Reading, PharmD Pharmacy Resident  02/07/2018 12:46 PM

## 2018-02-07 NOTE — Progress Notes (Signed)
Please note patient is currently followed by outpatient PALLIATIVE at [facility]. CSW York Spaniel made aware. Dayna Barker RN, BSN, [facility] [place], hospital liaison [phone]

## 2018-02-07 NOTE — Progress Notes (Deleted)
CBC sent to lab

## 2018-02-07 NOTE — Procedures (Signed)
ELECTROENCEPHALOGRAM REPORT   Patient: Duane Turner       Room #: IC03A-AA EEG No. ID: [id number] Age: 84 y.o.        Sex: male Referring Physician: Sherryll Burger Report Date:  02/07/2018        Interpreting Physician: Thana Farr  History: [name] is an 84 y.o. male with altered mental status  Medications:  Unasyn, ASA, Pepcid, Lasix, Insulin, Zyprexa, Pravachol  Conditions of Recording:  This is a 21 channel routine scalp EEG performed with bipolar and monopolar montages arranged in accordance to the international 10/20 system of electrode placement. One channel was dedicated to EKG recording.  The patient is in the intubated state.  Description:  Artifact is prominent during the recording often obscuring the background rhythm. When able to be visualized the background is slow and poorly organized.   It consists of a low voltage, polymorphic delta rhythm that is diffusely distributed and continuous.  There are superimposed faster frequencies noted as well.  This faster activity becomes more prominent when the patient is alerted. No epileptiform activity is noted.   Hyperventilation and intermittent photic stimulation were not performed.  IMPRESSION: This is an abnormal EEG secondary to general background slowing.  This finding may be seen with a diffuse disturbance that is etiologically nonspecific, but may include a metabolic encephalopathy, among other possibilities.  No epileptiform activity was noted.     Thana Farr, MD Neurology [phone] 02/07/2018, 4:36 PM

## 2018-02-07 NOTE — Progress Notes (Addendum)
[employer] - [facility]   PATIENT NAME: Duane Turner    MR#:  [id number]  DATE OF BIRTH:  [birth year]  SUBJECTIVE:   Patient is drowsy.  Confused  REVIEW OF SYSTEMS:  ROS- unable to obtain  DRUG ALLERGIES:  No Known Allergies VITALS:  Blood pressure 105/75, pulse 75, temperature (!) 96.6 F (35.9 C), resp. rate (!) 23, height 5\' 8"  (1.727 m), weight 81.7 kg, SpO2 97 %. PHYSICAL EXAMINATION:  Physical Exam  GENERAL:  84 y.o.-year-old patient lying in the bed, sleepy EYES: Pupils equal, round, reactive to light and accommodation. No scleral icterus. Extraocular muscles intact.  HEENT: Head atraumatic, normocephalic. Oropharynx and nasopharynx clear.  NECK:  Supple, no jugular venous distention. No thyroid enlargement, no tenderness.  LUNGS: Decreased breath sounds bilaterally.  Coarse breath sounds CARDIOVASCULAR: S1, S2 normal. No murmurs, rubs, or gallops.  ABDOMEN: Obese, soft, nontender, nondistended. Bowel sounds present. No organomegaly or mass.  EXTREMITIES: Bilateral lower extremity 2+ edema NEUROLOGIC: Drowzy.  Not following commands PSYCHIATRIC: Not oriented to time place and person SKIN: No obvious rash, lesion, or ulcer.  LABORATORY PANEL:  Male CBC Recent Labs  Lab 02/07/18 0407  WBC 13.2*  HGB 9.4*  HCT 28.1*  PLT 93*   ------------------------------------------------------------------------------------------------------------------ Chemistries  Recent Labs  Lab 02/07/18 0407  NA 137  K 5.1  CL 102  CO2 26  GLUCOSE 128*  BUN 75*  CREATININE 2.15*  CALCIUM 8.6*  MG 2.5*  AST 29  ALT 38  ALKPHOS 81  BILITOT 1.2   RADIOLOGY:  Ct Head Wo Contrast  Result Date: 02/06/2018 CLINICAL DATA:  Excessive somnolence.  Altered mental status. EXAM: CT HEAD WITHOUT CONTRAST TECHNIQUE: Contiguous axial images were obtained from the base of the skull through the vertex without intravenous contrast. COMPARISON:  Head CT  09/28/2015 FINDINGS: Brain: There is no mass, hemorrhage or extra-axial collection. The size and configuration of the ventricles and extra-axial CSF spaces are normal. There is an old right basal ganglia lacunar infarct. There is hypoattenuation of the periventricular white matter, most commonly indicating chronic ischemic microangiopathy. Vascular: No abnormal hyperdensity of the major intracranial arteries or dural venous sinuses. No intracranial atherosclerosis. Skull: The visualized skull base, calvarium and extracranial soft tissues are normal. Sinuses/Orbits: No fluid levels or advanced mucosal thickening of the visualized paranasal sinuses. No mastoid or middle ear effusion. The orbits are normal. 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 Electronically Signed   By: Deatra Robinson M.D.   On: 02/06/2018 16:14   Dg Chest Port 1 View  Result Date: 02/07/2018 CLINICAL DATA:  Shortness of breath EXAM: PORTABLE CHEST 1 VIEW COMPARISON:  02/06/2018 FINDINGS: Cardiac shadow is enlarged but stable. Right-sided PICC line is again noted in satisfactory position. Lungs are well aerated with the exception of patchy infiltrate in the right mid and lower lung stable from the prior exam. No new focal abnormality is noted. IMPRESSION: Stable right sided infiltrate.  Continued follow-up is recommended. Electronically Signed   By: Alcide Clever M.D.   On: 02/07/2018 07:02   ASSESSMENT AND PLAN:   84 year old male with a known history of chronic systolic heart failure, diabetes mellitus type 2, hyperaldosteronism, hypertension, prostate hypertrophy, bradycardia was referred from [facility] for lethargy, respiratory distress and bradycardia.  Acute hypoxic hypercapneic respiratory failure- likely secondary to acute systolic CHF exacerbation and HCAP - initially on BiPAP, now on [place] - management  per CCM  Septic  shock-  due to HCAP/aspiration pneumonia - changed from vanc/cefepime to unasyn  - blood and urine cultures pending  Acute on chronic systolic heart failure- improving. Recent ECHO with EF 30-35%. - continue IV lasix  - cardiology consulted  Altered mental status- patient very sleepy today, may be medication related - CT head pending  Bradycardia- resolved, TSH normal - cardiology consulted - has been on dobutamine  Type 2 diabetes mellitus- blood sugars well controlled - continue SSI  CODE STATUS: DNR  TOTAL TIME TAKING CARE OF THIS PATIENT: 32 minutes.   Molinda Bailiff Vinson Tietze M.D on 02/07/2018 at 12:20 PM  Between 7am to 6pm - Pager - [phone]  After 6pm go to [email or web address] - Scientist, research (life sciences) [facility]  Office  [phone]  CC: Primary care physician; Lauro Regulus, MD  Note: This dictation was prepared with Dragon dictation along with smaller phrase technology. Any transcriptional errors that result from this process are unintentional.

## 2018-02-07 NOTE — Progress Notes (Signed)
Pharmacy Antibiotic Note  Duane Turner is a 84 y.o. male admitted on 02/05/2018 with sepsis.  Patient admitted from [facility] facility and is requiring BiPAP. Pharmacy has been consulted for Unasyn dosing.  Plan: Discontinue cefepime 2g IV Q12hr per CCM discussion, and initiate Unasyn 3 g q6h due to procalcitonin increase from 4.51 on 10/16 to 8.27 on 10/17. Discussed during CCM rounds.  Height: 5\' 8"  (172.7 cm) Weight: 180 lb 1.9 oz (81.7 kg) IBW/kg (Calculated) : 68.4  Temp (24hrs), Avg:98.9 F (37.2 C), Min:96.8 F (36 C), Max:100 F (37.8 C)  Recent Labs  Lab 01/31/18 1732 02/01/18 0221 02/02/18 0521 02/05/18 1222 02/05/18 1341 02/06/18 0055 02/06/18 1635  WBC 2.5* 2.4*  --  2.9*  --  5.8  --   CREATININE 1.45* 1.57* 1.61* 1.51*  --  1.80* 2.07*  LATICACIDVEN  --   --   --   --  1.4  --   --     Estimated Creatinine Clearance: 26.6 mL/min (A) (by C-G formula based on SCr of 2.07 mg/dL (H)).    No Known Allergies  Antimicrobials this admission: Unasyn 10/17 >> Cefepime 10/16 >> 10/17 Vancomycin 10/16 >> 10/17  Dose adjustments this admission: N/A 10/18 0130 Unasyn changed to q 12 hours due to CrCl now ~27.  Microbiology results: 10/16 BCx: NG < 1 day 10/16 UCx: pending  10/16 MRSA PCR: pending   Thank you for allowing pharmacy to be a part of this patient's care.   Vasco Chong S, PharmD 02/07/2018 1:36 AM

## 2018-02-07 NOTE — Progress Notes (Signed)
eeg completed ° °

## 2018-02-07 NOTE — Progress Notes (Addendum)
Pharmacy Antibiotic Note  Duane Turner is a 84 y.o. male admitted on 02/05/2018 with aspiration pneumonia.  Patient admitted from [facility] facility and is requiring BiPAP. Pharmacy has been consulted for Unasyn dosing.  Plan: Continue Unasyn 3 g q6h x 7 days (stop date 10/23). Patient appears to be acutely dry and expect serum creatinine/creatinine clearance to return to baseline. If creatinine clearance remains low, will consider transition to Q12hr dosing.   Height: 5\' 8"  (172.7 cm) Weight: 180 lb 1.9 oz (81.7 kg) IBW/kg (Calculated) : 68.4  Temp (24hrs), Avg:97.5 F (36.4 C), Min:96.4 F (35.8 C), Max:100 F (37.8 C)  Recent Labs  Lab 01/31/18 1732 02/01/18 0221 02/02/18 0521 02/05/18 1222 02/05/18 1341 02/06/18 0055 02/06/18 1635 02/07/18 0407  WBC 2.5* 2.4*  --  2.9*  --  5.8  --  13.2*  CREATININE 1.45* 1.57* 1.61* 1.51*  --  1.80* 2.07* 2.15*  LATICACIDVEN  --   --   --   --  1.4  --   --   --     Estimated Creatinine Clearance: 25.6 mL/min (A) (by C-G formula based on SCr of 2.15 mg/dL (H)).    No Known Allergies  Antimicrobials this admission: Unasyn 10/17 >> 10/23 Cefepime 10/16 >> 10/17 Vancomycin 10/16 >> 10/17  Dose adjustments this admission: 10/18 Decreased Unasyn from 3 g q6h to 3 g q12h 10/18 Increased Unasyn to 3 g q6h  Microbiology results: 10/16 BCx: NG x 2 day 10/16 UCx: NG 10/16 MRSA PCR: pending   Thank you for allowing pharmacy to be a part of this patient's care.   Mauri Reading, PharmD Pharmacy Resident  02/07/2018 12:40 PM

## 2018-02-08 ENCOUNTER — Inpatient Hospital Stay: Payer: Medicare Other

## 2018-02-08 DIAGNOSIS — J9622 Acute and chronic respiratory failure with hypercapnia: Secondary | ICD-10-CM

## 2018-02-08 DIAGNOSIS — I5023 Acute on chronic systolic (congestive) heart failure: Secondary | ICD-10-CM

## 2018-02-08 DIAGNOSIS — R063 Periodic breathing: Secondary | ICD-10-CM

## 2018-02-08 DIAGNOSIS — J9621 Acute and chronic respiratory failure with hypoxia: Secondary | ICD-10-CM

## 2018-02-08 LAB — COMPREHENSIVE METABOLIC PANEL
ALK PHOS: 83 U/L (ref 38–126)
ALT: 67 U/L — ABNORMAL HIGH (ref 0–44)
AST: 62 U/L — AB (ref 15–41)
Albumin: 3 g/dL — ABNORMAL LOW (ref 3.5–5.0)
Anion gap: 11 (ref 5–15)
BILIRUBIN TOTAL: 1.7 mg/dL — AB (ref 0.3–1.2)
BUN: 85 mg/dL — AB (ref 8–23)
CALCIUM: 8.7 mg/dL — AB (ref 8.9–10.3)
CO2: 24 mmol/L (ref 22–32)
CREATININE: 2.05 mg/dL — AB (ref 0.61–1.24)
Chloride: 106 mmol/L (ref 98–111)
GFR calc Af Amer: 33 mL/min — ABNORMAL LOW (ref >60)
GFR calc non Af Amer: 29 mL/min — ABNORMAL LOW (ref >60)
GLUCOSE: 130 mg/dL — AB (ref 70–99)
Potassium: 5.2 mmol/L — ABNORMAL HIGH (ref 3.5–5.1)
Sodium: 141 mmol/L (ref 135–145)
TOTAL PROTEIN: 6.6 g/dL (ref 6.5–8.1)

## 2018-02-08 LAB — CBC WITH DIFFERENTIAL/PLATELET
Abs Immature Granulocytes: 0.1 10*3/uL — ABNORMAL HIGH (ref 0.00–0.07)
Basophils Absolute: 0 10*3/uL (ref 0.0–0.1)
Basophils Relative: 0 %
EOS PCT: 0 %
Eosinophils Absolute: 0 10*3/uL (ref 0.0–0.5)
HEMATOCRIT: 30.6 % — AB (ref 39.0–52.0)
HEMOGLOBIN: 10 g/dL — AB (ref 13.0–17.0)
IMMATURE GRANULOCYTES: 1 %
LYMPHS ABS: 0.6 10*3/uL — AB (ref 0.7–4.0)
LYMPHS PCT: 4 %
MCH: 29.8 pg (ref 26.0–34.0)
MCHC: 32.7 g/dL (ref 30.0–36.0)
MCV: 91.1 fL (ref 80.0–100.0)
MONOS PCT: 8 %
Monocytes Absolute: 1 10*3/uL (ref 0.1–1.0)
Neutro Abs: 11.9 10*3/uL — ABNORMAL HIGH (ref 1.7–7.7)
Neutrophils Relative %: 87 %
Platelets: 96 10*3/uL — ABNORMAL LOW (ref 150–400)
RBC: 3.36 MIL/uL — ABNORMAL LOW (ref 4.22–5.81)
RDW: 15.8 % — ABNORMAL HIGH (ref 11.5–15.5)
WBC: 13.6 10*3/uL — ABNORMAL HIGH (ref 4.0–10.5)
nRBC: 1.3 % — ABNORMAL HIGH (ref 0.0–0.2)

## 2018-02-08 LAB — GLUCOSE, CAPILLARY
GLUCOSE-CAPILLARY: 105 mg/dL — AB (ref 70–99)
Glucose-Capillary: 113 mg/dL — ABNORMAL HIGH (ref 70–99)
Glucose-Capillary: 120 mg/dL — ABNORMAL HIGH (ref 70–99)
Glucose-Capillary: 123 mg/dL — ABNORMAL HIGH (ref 70–99)
Glucose-Capillary: 140 mg/dL — ABNORMAL HIGH (ref 70–99)

## 2018-02-08 LAB — MAGNESIUM: Magnesium: 2.9 mg/dL — ABNORMAL HIGH (ref 1.7–2.4)

## 2018-02-08 MED ORDER — SODIUM CHLORIDE 0.9 % IV SOLN
3.0000 g | Freq: Two times a day (BID) | INTRAVENOUS | Status: DC
  Administered 2018-02-08 – 2018-02-09 (×2): 3 g via INTRAVENOUS
  Filled 2018-02-08 (×3): qty 3

## 2018-02-08 NOTE — Progress Notes (Signed)
Pharmacy Electrolyte Monitoring Consult:  Pharmacy consulted to assist in monitoring and replacing electrolytes in this 84 y.o. male admitted on 02/05/2018 with respiratory failure.   Labs:  Sodium (mmol/L)  Date Value  02/08/2018 141   Potassium (mmol/L)  Date Value  02/08/2018 5.2 (H)   Magnesium (mg/dL)  Date Value  16/01/9603 2.9 (H)   Phosphorus (mg/dL)  Date Value  54/12/8117 3.9   Calcium (mg/dL)  Date Value  14/78/2956 8.7 (L)   Albumin (g/dL)  Date Value  21/30/8657 3.0 (L)    Assessment/Plan: Electrolyte goals: Potassium ~4.0 and Magnesium ~2.0  No need for electrolyte replacement.  Will recheck electrolytes with AM BMP.   Pharmacy will continue to monitor and adjust per consult.   Stormy Card, [facility] 02/08/2018 9:11 AM

## 2018-02-08 NOTE — Progress Notes (Signed)
        Subjective:    Patient ID: Duane Turner, male    DOB: [birth year], 84 y.o.   MRN: [id number]  HPI Remains confused. In no distress on nasal cannula 02. He is having episodes of periodic breathing/Cheyne Stokes respirations. This is likely a reflection of CNS issues and cardiac issues. LEVF is 30 to 35%. He has prior evidence of CVA and currently has toxic metabolic encephalopathy.   Review of Systems  Unable to perform ROS: Mental status change      Intake/Output: Total I/O In: 13 [I.V.:13] Out: 375 [Urine:375]   Vitals: Vitals:   02/08/18 1100 02/08/18 1200  BP: (!) 101/58 115/60  Pulse: 79 78  Resp: (!) 22 12  Temp: (!) 97.5 F (36.4 C) (!) 97.2 F (36.2 C)  SpO2: 99% 100%        Objective:   Physical Exam  General: chronically ill appearing elderly male in no respiratory distress on nasal cannula. He exhibits periodic breathing. Neuro: lethargic, but more responsive to stimulus than yesterday HEENT: no JVD present, trachea midline, exceedingly poor dentition. Cardiovascular: regular rate and rhythm, grade 2/6 systolic ejection murmur of the left upper sternal border  Lungs: very faint rales at the bases, no wheezes or rhonchi. Abdomen: hypoactive BS present, non-distended, soft.  Musculoskeletal: 1+ generalized edema  Skin: chronic stasis changes bilaterally     Assessment & Plan:   1) Acute on chronic hypercarbic and hypoxic respiratory failure due to acute on chronic systolic CHF exacerbation. Doubt underlying pneumonia though cannot be excluded. Patient has responded to IV Lasix therapy. He is exhibiting periodic breathing/Cheyne Stokes respirations this is seen in patients with cardiac disease and CNS disease which he has both. This is a poor prognostic sign. Continue supplementation with oxygen. We'll discuss with son and POA recommend transitioning to comfort care.  2) Ischemic cardiomyopathy with chronic CHF sensation as noted above.  3) Altered  mental status: this is likely due to toxic metabolic encephalopathy. The patient is slowly improving however still with issues. EEG was consistent with metabolic encephalopathy. No acute CNS event appears to have precipitated this however a stroke could have been missed on initial CT. Neurology recommends MRI when clinically feasible however overall I'm not sure how this will change his management due to his end-stage cardiac issues.  4) Acute on chronic renal failure with attendant metabolic derangements the patient is not a candidate for dialysis. Recheck renal panel in the morning.  5) Diabetes Mellitus type II, with end organ damage, this issue adds complexity to his management. Continue to monitor glucose is in cover as needed.  6) Prior CVA, issue adds complexity to his management.   Will transferred to floor once can transition to comfort measures.

## 2018-02-08 NOTE — Progress Notes (Signed)
Pharmacy Antibiotic Note  Duane Turner is a 84 y.o. male admitted on 02/05/2018 with aspiration pneumonia.  Patient admitted from [facility] facility and is requiring BiPAP. Pharmacy has been consulted for Unasyn dosing.  Plan: Decrease Unasyn 3g from q6h to q 12h x 7 days (stop date 10/23). Patient appears to be acutely dry and expect serum creatinine/creatinine clearance to return to baseline.  Height: 5\' 8"  (172.7 cm) Weight: 177 lb 4 oz (80.4 kg) IBW/kg (Calculated) : 68.4  Temp (24hrs), Avg:98.5 F (36.9 C), Min:96.6 F (35.9 C), Max:100.4 F (38 C)  Recent Labs  Lab 02/05/18 1222 02/05/18 1341 02/06/18 0055 02/06/18 1635 02/07/18 0407 02/07/18 1400 02/08/18 0358  WBC 2.9*  --  5.8  --  13.2*  --  13.6*  CREATININE 1.51*  --  1.80* 2.07* 2.15* 2.23* 2.05*  LATICACIDVEN  --  1.4  --   --   --   --   --     Estimated Creatinine Clearance: 26.9 mL/min (A) (by C-G formula based on SCr of 2.05 mg/dL (H)).    No Known Allergies  Antimicrobials this admission: Unasyn 10/17 >> 10/23 Cefepime 10/16 >> 10/17 Vancomycin 10/16 >> 10/17  Dose adjustments this admission: 10/18 Decreased Unasyn from 3 g q6h to 3 g q12h 10/18 Increased Unasyn to 3 g q6h 10/19 Unasyn Q6H >> Q12H   Microbiology results: 10/16 BCx: NG x 2 day 10/16 UCx: NG 10/16 MRSA PCR: pending   Thank you for allowing pharmacy to be a part of this patient's care.   Stormy Card, [facility] 02/08/2018 9:13 AM

## 2018-02-08 NOTE — Progress Notes (Signed)
Dr Jayme Cloud talked to son [name] via phone and updated him re lack of progress. Agree to transfer patient to Ic for comfort care and possible transfer back to [facility] with hospice

## 2018-02-09 ENCOUNTER — Encounter: Payer: Self-pay | Admitting: Emergency Medicine

## 2018-02-09 LAB — BASIC METABOLIC PANEL
Anion gap: 8 (ref 5–15)
BUN: 92 mg/dL — AB (ref 8–23)
CHLORIDE: 110 mmol/L (ref 98–111)
CO2: 27 mmol/L (ref 22–32)
Calcium: 8.7 mg/dL — ABNORMAL LOW (ref 8.9–10.3)
Creatinine, Ser: 1.9 mg/dL — ABNORMAL HIGH (ref 0.61–1.24)
GFR calc Af Amer: 36 mL/min — ABNORMAL LOW (ref >60)
GFR calc non Af Amer: 31 mL/min — ABNORMAL LOW (ref >60)
Glucose, Bld: 137 mg/dL — ABNORMAL HIGH (ref 70–99)
POTASSIUM: 5.1 mmol/L (ref 3.5–5.1)
SODIUM: 145 mmol/L (ref 135–145)

## 2018-02-09 MED ORDER — MORPHINE SULFATE (PF) 2 MG/ML IV SOLN: 1.0000 mg | INTRAVENOUS | Status: DC | PRN

## 2018-02-09 MED ORDER — HALOPERIDOL 0.5 MG PO TABS
0.5000 mg | ORAL_TABLET | ORAL | Status: DC | PRN
  Filled 2018-02-09: qty 1

## 2018-02-09 MED ORDER — LORAZEPAM 2 MG/ML PO CONC: 1.0000 mg | ORAL | Status: DC | PRN

## 2018-02-09 MED ORDER — DIPHENHYDRAMINE HCL 50 MG/ML IJ SOLN
12.5000 mg | INTRAMUSCULAR | Status: DC | PRN
  Filled 2018-02-09: qty 0.25

## 2018-02-09 MED ORDER — LORAZEPAM 1 MG PO TABS: 1.0000 mg | ORAL_TABLET | ORAL | Status: DC | PRN

## 2018-02-09 MED ORDER — LORAZEPAM 2 MG/ML IJ SOLN: 1.0000 mg | INTRAMUSCULAR | Status: DC | PRN

## 2018-02-09 MED ORDER — HALOPERIDOL LACTATE 2 MG/ML PO CONC
0.5000 mg | ORAL | Status: DC | PRN
  Filled 2018-02-09: qty 0.3

## 2018-02-09 MED ORDER — HALOPERIDOL LACTATE 5 MG/ML IJ SOLN: 0.5000 mg | INTRAMUSCULAR | Status: DC | PRN

## 2018-02-09 NOTE — Plan of Care (Signed)

## 2018-02-09 NOTE — Progress Notes (Signed)
Spoke with Dr. Elpidio Turner, the patient was transferred out of ICU yesterday after discussion with his son and POA Duane Turner. Duane Turner wants to transition his father to hospice care at his prior SNF. This is appropriate given the patient's multiple comorbid conditions. Patient has been made comfort care.  Pulmonary/Critical Care will sign off please read consult as needed.

## 2018-02-09 NOTE — Clinical Social Work Note (Signed)
CSW received request from attending MD to send referral to [facility]. CSW has done such; the facility has no available beds for today, but will have the hospital liaison follow up tomorrow. The CSW is following.  Argentina Ponder, MSW, [facility] [phone]

## 2018-02-09 NOTE — ACP (Advance Care Planning) (Addendum)
Purpose of Encounter Pneumonia, worsening.  Parties in [employer] power of attorney Ripley Bogosian( Son)  Patients Decisional capacity Patient with mental status change and nonverbal.  Discussed with patient's son regarding pneumonia, congestive heart failure and continued worsening in spite of aggressive care.  Patient not taking any more oral food or medications.  Patient we discussed regarding comfort care and transition to hospice home.  Son agrees.  DO NOT RESUSCITATE and DO NOT INTUBATE.  Time spent - 20 minutes

## 2018-02-09 NOTE — Progress Notes (Signed)
Pharmacy Electrolyte Monitoring Consult:  Pharmacy consulted to assist in monitoring and replacing electrolytes in this 84 y.o. male admitted on 02/05/2018 with respiratory failure.   Labs:  Sodium (mmol/L)  Date Value  02/09/2018 145   Potassium (mmol/L)  Date Value  02/09/2018 5.1   Magnesium (mg/dL)  Date Value  65/78/4696 2.9 (H)   Phosphorus (mg/dL)  Date Value  29/52/8413 3.9   Calcium (mg/dL)  Date Value  24/40/1027 8.7 (L)   Albumin (g/dL)  Date Value  25/36/6440 3.0 (L)    Assessment/Plan: Electrolyte goals: Potassium ~4.0 and Magnesium ~2.0  No need for electrolyte replacement.  Will recheck electrolytes with AM BMP.   Pharmacy will continue to monitor and adjust per consult.   Stormy Card, [facility] 02/09/2018 7:44 AM

## 2018-02-09 NOTE — NC FL2 (Signed)
[place] MEDICAID FL2 LEVEL OF CARE SCREENING TOOL     IDENTIFICATION  Patient Name: Duane Turner Birthdate: 05-22-1932 Sex: male Admission Date (Current Location): 02/05/2018  [place] and [place] Number:  Duane Turner [id number] [facility] Facility and Address:  [facility], [address] [place], [place] [address]      Provider Number: [id number]  Attending Physician Name and Address:  Milagros Loll, MD  Relative Name and Phone Number:  Raegan Sipp [facility]) [phone] or [phone] and Blinda Leatherwood (Sister) [phone]    Current Level of Care: Hospital Recommended Level of Care: Skilled Nursing Facility Prior Approval Number:    Date Approved/Denied:   PASRR Number: [id number]  Discharge Plan: SNF    Current Diagnoses: Patient Active Problem List   Diagnosis Date Noted  . Sepsis (HCC) 02/05/2018  . Hyperkalemia 01/31/2018  . DIABETES MELLITUS, TYPE II, UNCONTROLLED 05/17/2009  . HYPERTENSION, BENIGN ESSENTIAL 05/17/2009  . TOBACCO USER 01/29/2009  . ERECTILE DYSFUNCTION 12/23/2007  . HYPERLIPIDEMIA 11/29/2006  . ANEMIA NOS 11/01/2006  . PROTEINURIA, MILD 11/01/2006    Orientation RESPIRATION BLADDER Height & Weight     Self, Place  O2(1.5 L o2) Incontinent Weight: 177 lb 4 oz (80.4 kg) Height:  5\' 8"  (172.7 cm)  BEHAVIORAL SYMPTOMS/MOOD NEUROLOGICAL BOWEL NUTRITION STATUS      Continent Diet(NPO to be advanced to Carb Modified)  AMBULATORY STATUS COMMUNICATION OF NEEDS Skin   Extensive Assist Verbally Other (Comment)(Other (Comment)(LEE chronic venous changes: Need ongoing wound care at the facility.))                       Personal Care Assistance Level of Assistance  Bathing, Feeding, Dressing Bathing Assistance: Maximum assistance Feeding assistance: Independent Dressing Assistance: Maximum assistance     Functional Limitations Info  Sight, Hearing, Speech Sight Info: Adequate Hearing Info: Adequate Speech Info:  Adequate    SPECIAL CARE FACTORS FREQUENCY                       Contractures Contractures Info: Not present    Additional Factors Info  Code Status, Allergies Code Status Info: DNR Allergies Info: No Known Allergies           Current Medications (02/09/2018):  This is the current hospital active medication list Current Facility-Administered Medications  Medication Dose Route Frequency Provider Last Rate Last Dose  . acetaminophen (TYLENOL) tablet 650 mg  650 mg Oral Q6H PRN Ihor Austin, MD       Or  . acetaminophen (TYLENOL) suppository 650 mg  650 mg Rectal Q6H PRN Pyreddy, Pavan, MD      . Ampicillin-Sulbactam (UNASYN) 3 g in sodium chloride 0.9 % 100 mL IVPB  3 g Intravenous Q12H Shah, Rutul, MD 200 mL/hr at 02/09/18 0900 3 g at 02/09/18 0900  . aspirin EC tablet 81 mg  81 mg Oral Daily Pyreddy, Pavan, MD      . chlorhexidine (PERIDEX) 0.12 % solution 15 mL  15 mL Mouth Rinse BID Harlon Ditty D, NP   15 mL at 02/08/18 2245  . famotidine (PEPCID) IVPB 20 mg premix  20 mg Intravenous Q24H Milagros Loll, MD   Stopped at 02/09/18 0700  . heparin injection 5,000 Units  5,000 Units Subcutaneous Q8H Roseanne Reno, MD   5,000 Units at 02/09/18 0611  . hydrocerin (EUCERIN) cream 1 application  1 application Topical Daily PRN Pyreddy, Vivien Rota, MD      . ipratropium-albuterol (DUONEB) 0.5-2.5 (3) MG/3ML  nebulizer solution 3 mL  3 mL Nebulization Q4H PRN Harlon Ditty D, NP      . MEDLINE mouth rinse  15 mL Mouth Rinse q12n4p Harlon Ditty D, NP   15 mL at 02/08/18 1645  . OLANZapine (ZYPREXA) tablet 5 mg  5 mg Oral QHS Roseanne Reno, MD      . ondansetron (ZOFRAN) tablet 4 mg  4 mg Oral Q6H PRN Pyreddy, Vivien Rota, MD       Or  . ondansetron (ZOFRAN) injection 4 mg  4 mg Intravenous Q6H PRN Pyreddy, Pavan, MD      . pravastatin (PRAVACHOL) tablet 40 mg  40 mg Oral QHS Pyreddy, Pavan, MD      . senna-docusate (Senokot-S) tablet 1 tablet  1 tablet Oral QHS PRN Pyreddy, Pavan, MD       . sodium chloride flush (NS) 0.9 % injection 10-40 mL  10-40 mL Intracatheter PRN Ihor Austin, MD         Discharge Medications: Please see discharge summary for a list of discharge medications.  Relevant Imaging Results:  Relevant Lab Results:   Additional Information SS# [id number]  Judi Cong, LCSW

## 2018-02-09 NOTE — Progress Notes (Signed)
   [employer] - [facility]   PATIENT NAME: Duane Turner    MR#:  [id number]  DATE OF BIRTH:  [birth year]  SUBJECTIVE:   Continues to be drowsy and confused.  No oral intake  REVIEW OF SYSTEMS:  ROS- unable to obtain  DRUG ALLERGIES:  No Known Allergies VITALS:  Blood pressure 112/66, pulse (!) 55, temperature (!) 97.5 F (36.4 C), resp. rate 18, height 5\' 8"  (1.727 m), weight 80.4 kg, SpO2 (!) 87 %. PHYSICAL EXAMINATION:  Physical Exam  GENERAL:  84 y.o.-year-old patient lying in the bed, sleepy EYES: Pupils equal, round, reactive to light and accommodation. No scleral icterus. Extraocular muscles intact.  HEENT: Head atraumatic, normocephalic. Oropharynx and nasopharynx clear.  NECK:  Supple, no jugular venous distention. No thyroid enlargement, no tenderness.  LUNGS: Decreased breath sounds bilaterally.  Coarse breath sounds CARDIOVASCULAR: S1, S2 normal. No murmurs, rubs, or gallops.  ABDOMEN: Obese, soft, nontender, nondistended. Bowel sounds present. No organomegaly or mass.  EXTREMITIES: Bilateral lower extremity 2+ edema NEUROLOGIC: Drowzy.  Not following commands PSYCHIATRIC: Not oriented to time place and person SKIN: No obvious rash, lesion, or ulcer.  LABORATORY PANEL:  Male CBC Recent Labs  Lab 02/08/18 0358  WBC 13.6*  HGB 10.0*  HCT 30.6*  PLT 96*   ------------------------------------------------------------------------------------------------------------------ Chemistries  Recent Labs  Lab 02/08/18 0358 02/09/18 0438  NA 141 145  K 5.2* 5.1  CL 106 110  CO2 24 27  GLUCOSE 130* 137*  BUN 85* 92*  CREATININE 2.05* 1.90*  CALCIUM 8.7* 8.7*  MG 2.9*  --   AST 62*  --   ALT 67*  --   ALKPHOS 83  --   BILITOT 1.7*  --    RADIOLOGY:  No results found. ASSESSMENT AND PLAN:   84 year old male with a known history of chronic systolic heart failure, diabetes mellitus type 2, hyperaldosteronism, hypertension, prostate  hypertrophy, bradycardia was referred from [facility] facility for lethargy, respiratory distress and bradycardia.  Acute hypoxic hypercapneic respiratory failure- likely secondary to acute systolic CHF exacerbation and HCAP - initially on BiPAP, now on [place] - management per PCCM  Septic shock - due to HCAP/aspiration pneumonia - changed from vanc/cefepime to unasyn - blood and urine cultures pending  Acute on chronic systolic heart failure - improving.  Recent ECHO with EF 30-35% - continue IV lasix  - cardiology consulted  Altered mental status - patient very sleepy today, may be medication related - CT head pending  Bradycardia- resolved, TSH normal - cardiology consulted - has been on dobutamine  Type 2 diabetes mellitus- blood sugars well controlled - continue SSI  CODE STATUS: DNR  TOTAL TIME TAKING CARE OF THIS PATIENT: 35 minutes.   Molinda Bailiff Yasamin Karel M.D on 02/09/2018 at 12:02 PM  Between 7am to 6pm - Pager - [phone]  After 6pm go to [email or web address] - Scientist, research (life sciences) [facility]  Office  [phone]  CC: Primary care physician; Lauro Regulus, MD  Note: This dictation was prepared with Dragon dictation along with smaller phrase technology. Any transcriptional errors that result from this process are unintentional.

## 2018-02-09 NOTE — Progress Notes (Addendum)
   [facility] [employer] [facility] [place] at [facility]   PATIENT NAME: Duane Turner    MR#:  [id number]  DATE OF BIRTH:  Aug 16, 1932  SUBJECTIVE:   Continues to be drowsy and confused.  No oral intake. Unable to give oral medications Afebrile  REVIEW OF SYSTEMS:  ROS- unable to obtain  DRUG ALLERGIES:  No Known Allergies VITALS:  Blood pressure 112/66, pulse (!) 55, temperature (!) 97.5 F (36.4 C), resp. rate 18, height 5\' 8"  (1.727 m), weight 80.4 kg, SpO2 (!) 87 %. PHYSICAL EXAMINATION:  Physical Exam  GENERAL:  84 y.o.-year-old patient lying in the bed, sleepy. EYES: Pupils equal, round, reactive to light and accommodation. No scleral icterus. Extraocular muscles intact.  HEENT: Head atraumatic, normocephalic. Oropharynx and nasopharynx clear.  NECK:  Supple, no jugular venous distention. No thyroid enlargement, no tenderness.  LUNGS: Decreased breath sounds bilaterally.  Coarse breath sounds. CARDIOVASCULAR: S1, S2 normal. No murmurs, rubs, or gallops.  ABDOMEN: Obese, soft, nontender, nondistended. Bowel sounds present. No organomegaly or mass.  EXTREMITIES: Bilateral lower extremity 2+ edema NEUROLOGIC: Drowzy.  Not following commands PSYCHIATRIC: Not oriented to time place and person SKIN: No obvious rash, lesion, or ulcer.  LABORATORY PANEL:  Male CBC Recent Labs  Lab 02/08/18 0358  WBC 13.6*  HGB 10.0*  HCT 30.6*  PLT 96*   ------------------------------------------------------------------------------------------------------------------ Chemistries  Recent Labs  Lab 02/08/18 0358 02/09/18 0438  NA 141 145  K 5.2* 5.1  CL 106 110  CO2 24 27  GLUCOSE 130* 137*  BUN 85* 92*  CREATININE 2.05* 1.90*  CALCIUM 8.7* 8.7*  MG 2.9*  --   AST 62*  --   ALT 67*  --   ALKPHOS 83  --   BILITOT 1.7*  --    RADIOLOGY:  No results found. ASSESSMENT AND PLAN:   84 year old male with a known history of chronic systolic heart failure, diabetes mellitus type  2, hyperaldosteronism, hypertension, prostate hypertrophy, bradycardia was referred from [facility] for lethargy, respiratory distress and bradycardia.  * Acute hypoxic hypercapneic respiratory failure- likely secondary to acute systolic CHF exacerbation and HCAP  * Septic shock - due to HCAP/aspiration pneumonia  * Acute on chronic systolic heart failure - improving.  *Acute metabolic encephalopathy  * Bradycardia  * Type 2 diabetes mellitus  Transferred out of ICU yesterday evening.  Transition to comfort care as per family request due to no improvement in spite of aggressive care.  CODE STATUS: DNR  TOTAL TIME TAKING CARE OF THIS PATIENT: 30 minutes.   Molinda Bailiff Kasey Ewings M.D on 02/09/2018 at 12:05 PM  Between 7am to 6pm - Pager - [phone]  After 6pm go to [email or web address] - Scientist, research (life sciences) [place] [facility]  Office  [phone]  CC: Primary care physician; Lauro Regulus, MD  Note: This dictation was prepared with Dragon dictation along with smaller phrase technology. Any transcriptional errors that result from this process are unintentional.

## 2018-02-10 LAB — CULTURE, BLOOD (ROUTINE X 2)
CULTURE: NO GROWTH
Culture: NO GROWTH
SPECIAL REQUESTS: ADEQUATE
Special Requests: ADEQUATE

## 2018-02-10 NOTE — Progress Notes (Signed)
Subjective: Patient found to be asleep but was able to open eyes to verbal stimuli. He appeared lethargic and very short of breath with minimal exertion. He is able to follow simple commands when prompted. Per nursing reports, he has not been eating or drinking well since was transferred from the ICU. Patient is pending transfer to [facility] today per his Son's wishes who is his POA.  Objective: Current vital signs: BP 125/83 (BP Location: Left Arm)   Pulse 66   Temp (!) 97.5 F (36.4 C) (Oral)   Resp 20   Ht 5\' 8"  (1.727 m)   Wt 80.4 kg   SpO2 99%   BMI 26.95 kg/m  Vital signs in last 24 hours: Temp:  [97.3 F (36.3 C)-97.5 F (36.4 C)] 97.5 F (36.4 C) (10/21 0446) Pulse Rate:  [66-67] 66 (10/21 0446) Resp:  [16-20] 20 (10/21 0446) BP: (114-125)/(76-83) 125/83 (10/21 0446) SpO2:  [99 %-100 %] 99 % (10/21 0446)  Intake/Output from previous day: 10/20 0701 - 10/21 0700 In: -  Out: 752 [Urine:750; Stool:2] Intake/Output this shift: No intake/output data recorded. Nutritional status:  Diet Order    None     Neurologic Exam:  Mental Status: Appears lethargic and short of breath but able to open eyes to verbal stimuli. Speech slow and inaudible due to significant shortness of breath. Able to follow 1 step commands with some difficulty.  Able to stick tongue out on command.  Cranial Nerves: II: Discs flat bilaterally; Visual fields intact to threat bilaterally, pupils equal, round, reactive to light and accommodation III,IV, VI: ptosis not present, extra-ocular motions intact bilaterally V,VII: Left facial asymmetry, facial light touch sensationintact painful stimuli VIII: Responds to sound IX,X: gag reflex deferred XI: Unable to assess XII: Tongue deviated to left Motor: Lifts both arms against gravity but does not lift legs off the bed Tone and bulk:normal tone throughout; no atrophy noted Sensory: Responds to painful stimuli right>left Deep Tendon Reflexes:  1+ with absent AJ's bilaterally Plantars: Right:upgoingLeft: upgoing Cerebellar: Unable to assess Gait: not tested due to safety concerns  Lab Results: Basic Metabolic Panel: Recent Labs  Lab 02/05/18 1222 02/06/18 0055 02/06/18 1635 02/07/18 0407 02/07/18 1400 02/08/18 0358 02/09/18 0438  NA 132* 130* 136 137 137 141 145  K 4.4 5.5* 5.1 5.1 5.6* 5.2* 5.1  CL 102 98 102 102 105 106 110  CO2 22 23 25 26 24 24 27   GLUCOSE 149* 239* 102* 128* 120* 130* 137*  BUN 74* 77* 77* 75* 86* 85* 92*  CREATININE 1.51* 1.80* 2.07* 2.15* 2.23* 2.05* 1.90*  CALCIUM 8.4* 8.1* 8.4* 8.6* 8.6* 8.7* 8.7*  MG 2.7* 2.5*  --  2.5*  --  2.9*  --   PHOS  --  3.2  --  3.9  --   --   --     Liver Function Tests: Recent Labs  Lab 02/05/18 1222 02/06/18 0055 02/07/18 0407 02/08/18 0358  AST 32 30 29 62*  ALT 51* 42 38 67*  ALKPHOS 95 81 81 83  BILITOT 0.7 0.9 1.2 1.7*  PROT 6.9 6.5 6.4* 6.6  ALBUMIN 3.2* 2.8* 2.9* 3.0*   No results for input(s): LIPASE, AMYLASE in the last 168 hours. Recent Labs  Lab 02/07/18 1100  AMMONIA 22    CBC: Recent Labs  Lab 02/05/18 1222 02/06/18 0055 02/07/18 0407 02/08/18 0358  WBC 2.9* 5.8 13.2* 13.6*  NEUTROABS  --  4.9 11.0* 11.9*  HGB 10.6* 9.0* 9.4* 10.0*  HCT 33.0* 27.2* 28.1* 30.6*  MCV 91.7 91.3 90.6 91.1  PLT 105* 93* 93* 96*    Cardiac Enzymes: Recent Labs  Lab 02/05/18 1222 02/05/18 1522 02/05/18 1957 02/06/18 0055 02/06/18 0130  TROPONINI 0.03* 0.04* 0.04* 0.25* 0.11*    Lipid Panel: No results for input(s): CHOL, TRIG, HDL, CHOLHDL, VLDL, LDLCALC in the last 168 hours.  CBG: Recent Labs  Lab 02/07/18 2340 02/08/18 0323 02/08/18 0720 02/08/18 1129 02/08/18 1556  GLUCAP 113* 120* 140* 123* 105*    Microbiology: Results for orders placed or performed during the hospital encounter of 02/05/18  Urine culture     Status: None   Collection Time: 02/05/18  1:16 PM  Result Value Ref Range  Status   Specimen Description   Final    URINE, RANDOM Performed at [facility], [address], [place], [place] [address]    Special Requests   Final    NONE Performed at [facility], [address] [place], [place] [address]    Culture   Final    NO GROWTH Performed at [facility], [address], [place], [place] [address]    Report Status 02/07/2018 FINAL  Final  Blood Culture (routine x 2)     Status: None   Collection Time: 02/05/18  1:41 PM  Result Value Ref Range Status   Specimen Description BLOOD LEFT HAND  Final   Special Requests   Final    BOTTLES DRAWN AEROBIC AND ANAEROBIC Blood Culture adequate volume   Culture   Final    NO GROWTH 5 DAYS Performed at [facility], [address], [place], [place] [address]    Report Status 02/10/2018 FINAL  Final  Blood Culture (routine x 2)     Status: None   Collection Time: 02/05/18  1:41 PM  Result Value Ref Range Status   Specimen Description BLOOD RIGHT ARM  Final   Special Requests   Final    BOTTLES DRAWN AEROBIC AND ANAEROBIC Blood Culture adequate volume   Culture   Final    NO GROWTH 5 DAYS Performed at [facility], [address] [place], [place] [address]    Report Status 02/10/2018 FINAL  Final    Coagulation Studies: No results for input(s): LABPROT, INR in the last 72 hours.  Imaging: No results found.  Medications:  I have reviewed the patient's current medications. Prior to Admission:  Medications Prior to Admission  Medication Sig Dispense Refill Last Dose  . acetaminophen (TYLENOL) 325 MG tablet Take 650 mg by mouth every 4 (four) hours as needed for mild pain or moderate pain.   unknown at unknown  . aspirin (ASPIRIN EC) 81 MG EC tablet Take 81 mg by mouth daily.     unknown at unknown  . insulin NPH-regular Human (NOVOLIN 70/30) (70-30) 100 UNIT/ML injection Inject 10 Units into the skin daily.   unknown at unknown  . magnesium oxide  (MAG-OX) 400 MG tablet Take 400 mg by mouth daily.   unknown at unknown  . midodrine (PROAMATINE) 10 MG tablet Take 10 mg by mouth 3 (three) times daily with meals.   unknown at unknown  . multivitamin (ONE-A-DAY MEN'S) TABS tablet Take 1 tablet by mouth daily.   unknown at unknown  . potassium chloride (K-DUR,KLOR-CON) 10 MEQ tablet Take 20 mEq by mouth daily.   unknown at unknown  . pravastatin (PRAVACHOL) 40 MG tablet Take 40 mg by mouth at bedtime.  unknown at unknown  . Skin Protectants, Misc. (EUCERIN) cream Apply 1 application topically daily as needed for dry skin. Apply to bilateral lower extremities   unknown at unknown  . torsemide (DEMADEX) 20 MG tablet Take 40 mg by mouth 2 (two) times daily.   unknown at unknown  . Vitamin D, Ergocalciferol, (DRISDOL) 50000 units CAPS capsule Take 50,000 Units by mouth every 7 (seven) days.   unknown at unknown  . insulin aspart (NOVOLOG) 100 UNIT/ML injection Inject 0-5 Units into the skin at bedtime. CBG < 70: implement hypoglycemia protocol  CBG 70 - 120: 0 units  CBG 121 - 150: 0 units  CBG 151 - 200: 0 units  CBG 201 - 250: 2 units  CBG 251 - 300: 3 units  CBG 301 - 350: 4 units  CBG 351 - 400: 5 units  CBG > 400 call MD (Patient not taking: Reported on 02/05/2018) 10 mL 11 Not Taking at Unknown time  . insulin aspart (NOVOLOG) 100 UNIT/ML injection Inject 0-9 Units into the skin 3 (three) times daily with meals. CBG < 70: implement hypoglycemia protocol  CBG 70 - 120: 0 units  CBG 121 - 150: 1 unit  CBG 151 - 200: 2 units  CBG 201 - 250: 3 units  CBG 251 - 300: 5 units  CBG 301 - 350: 7 units  CBG 351 - 400 9 units  CBG > 400 call MD (Patient not taking: Reported on 02/05/2018) 10 mL 11 Not Taking at Unknown time  . lisinopril (PRINIVIL,ZESTRIL) 10 MG tablet Take 1 tablet (10 mg total) by mouth at bedtime. For blood pressure (Patient not taking: Reported on 02/05/2018) 30 tablet 0 Not Taking at Unknown time   Scheduled:   Patient  seen and examined.  Clinical course and management discussed.  Necessary edits performed.  I agree with the above.  Assessment and plan of care developed and discussed below.     Assessment/Plan: Patient continues to do poorly although respiratory status has improved.  Family wishes comfort measures at this time therefore will not perform MRI of the brain or further investigate his LE weakness.  Likely etiologies will either have no measurable treatment to improve status or treatments will be more aggressive than what patient's medical condition can tolerate.    No further neurologic intervention is recommended at this time.  If further questions arise, please call or page at that time.  Thank you for allowing neurology to participate in the care of this patient.    LOS: 5 days   02/10/2018  11:57 AM  Thana Farr, MD Neurology [phone]  02/10/2018  2:24 PM

## 2018-02-10 NOTE — Care Management Important Message (Signed)
Important Message  Patient Details  Name: Duane Turner MRN: [id number] Date of Birth: 1933-01-23   Medicare Important Message Given:  Other (see comment) Went by to see patient again and he is still sleeping.  Tried calling Son, [name] at [phone] but it was VM for [facility] and Voicemail was full.    Olegario Messier A Addilynne Olheiser 02/10/2018, 11:56 AM

## 2018-02-10 NOTE — Discharge Summary (Signed)
[employer] [facility] [place] at [facility]   PATIENT NAME: Duane Turner    MR#:  [id number]  DATE OF BIRTH:  [birth year]  DATE OF ADMISSION:  02/05/2018 ADMITTING PHYSICIAN: Duane Austin, MD  DATE OF DISCHARGE: 02/10/2018  PRIMARY CARE PHYSICIAN: Duane Regulus, MD   ADMISSION DIAGNOSIS:  Bradycardia [R00.1] HCAP (healthcare-associated pneumonia) [J18.9] Acute on chronic respiratory failure with hypoxia (HCC) [J96.21] Acute on chronic congestive heart failure, unspecified heart failure type (HCC) [I50.9] Sepsis with encephalopathy and septic shock, due to unspecified organism (HCC) [A41.9, R65.21, G93.40]  DISCHARGE DIAGNOSIS:  Active Problems:   Sepsis (HCC) Septic shock Healthcare associated pneumonia Aspiration pneumonia Acute metabolic encephalopathy Bradycardia Acute hypoxic hypercapnic respiratory failure Acute systolic heart failure  SECONDARY DIAGNOSIS:   Past Medical History:  Diagnosis Date  . Anemia   . Arthritis   . BPH (benign prostatic hyperplasia)   . Chronic systolic heart failure (HCC)   . Diabetes mellitus without complication (HCC)   . Dysphagia   . Hyperaldosteronism (HCC)   . Hypertension   . Renal disorder      ADMITTING HISTORY Duane Turner  is a 84 y.o. male with a known history of chronic systolic heart failure, diabetes mellitus type 2, hyperaldosteronism, hypertension, prostate hypertrophy, bradycardia was recently discharged from our hospital on 02/02/2018.  Patient is a resident of [facility].  He was dizzy and short of breath.  EKG in the facility showed a junctional rhythm with heart rate of 45 bpm.  He was brought to the emergency room oxygen saturation was low and was put on BiPAP and stabilized.  The heart rate was around 40 bpm and he  received atropine by ER physician.  Heart rate improved after he was given atropine.  Patient was evaluated with chest x-ray which showed multifocal pneumonia and  effusion.  Was started on broad-spectrum IV antibiotics.  Core body temperature was 88 F patient was hypothermic and he was put on warming blanket.  Because of the low blood pressure patient also ordered 500 mL normal saline bolus.  Patient is awake and responds to verbal commands but not completely oriented.  He has generalized edema.  Nurse practitioner from [facility] facility is at bedside.  She discussed patient's condition with patient's son who is a power of attorney Duane Turner who does not want any cardiac resuscitation, CPR but wants only intubation and ventilator for respiratory failure.  According to the information from nurse practitioner in the last 2 weeks patient gained 40 pounds.  HOSPITAL COURSE:  Patient was admitted to ICU for sepsis with hypothermia and hypoxic respiratory failure.  Patient was initially put on BiPAP and started on broad-spectrum IV antibiotics.  Intensivist consultation was done.  Patient was started on IV dobutamine for hypotension secondary to sepsis.  He was diuresed with Lasix for heart failure exacerbation.  Patient was managed in the ICU for septic shock secondary to healthcare associated pneumonia and urinary tract infection.  Patient also had elevated troponin secondary to demand ischemia.  Acute on chronic systolic heart failure exacerbation was also managed in the ICU.  Nephrotoxic medications were ordered secondary to renal failure.  Patient mental status deteriorated and did not improve at all.  Neurology consultation was done along with EEG and ammonia level.  In spite of aggressive treatment measures patient did not improve.  Overall medical condition deteriorated.  Intensivist attending discussed with patient's family who do not want any aggressive measures and wanted only comfort care.  Patient was transitioned to comfort care measures and transferred to medical floor.  Hospice coordinator was contacted and patient's family wanted patient to be  discharged to hospice facility.  Patient has a bed at [facility] [place].  He will be discharged today.  CONSULTS OBTAINED:  Treatment Team:  Alwyn Pea, MD Thana Farr, MD  DRUG ALLERGIES:  No Known Allergies  DISCHARGE MEDICATIONS:   Allergies as of 02/10/2018   No Known Allergies     Medication List    STOP taking these medications   acetaminophen 325 MG tablet Commonly known as:  TYLENOL   aspirin EC 81 MG EC tablet Generic drug:  aspirin   eucerin cream   insulin aspart 100 UNIT/ML injection Commonly known as:  novoLOG   insulin NPH-regular Human (70-30) 100 UNIT/ML injection Commonly known as:  NOVOLIN 70/30   lisinopril 10 MG tablet Commonly known as:  PRINIVIL,ZESTRIL   magnesium oxide 400 MG tablet Commonly known as:  MAG-OX   midodrine 10 MG tablet Commonly known as:  PROAMATINE   multivitamin Tabs tablet   potassium chloride 10 MEQ tablet Commonly known as:  K-DUR,KLOR-CON   pravastatin 40 MG tablet Commonly known as:  PRAVACHOL   torsemide 20 MG tablet Commonly known as:  DEMADEX   Vitamin D (Ergocalciferol) 50000 units Caps capsule Commonly known as:  DRISDOL     Medications Ativan 1 mg orally every 4 as needed for anxiety Haldol 2 mg/ml  : 0.5 mg sublingual every 4 hours as needed for agitation Oxygen via nasal cannula  Today   VITAL SIGNS:  Blood pressure 125/83, pulse 66, temperature (!) 97.5 F (36.4 C), temperature source Oral, resp. rate 20, height 5\' 8"  (1.727 m), weight 80.4 kg, SpO2 99 %. Patient seen and evaluated today Has decreased responsiveness Opens eyes to verbal commands Not completely oriented to time place and person I/O:    Intake/Output Summary (Last 24 hours) at 02/10/2018 1046 Last data filed at 02/10/2018 0600 Gross per 24 hour  Intake -  Output 752 ml  Net -752 ml    PHYSICAL EXAMINATION:  Physical Exam  GENERAL:  85 y.o.-year-old patient lying in the bed with no  acute distress.  LUNGS: Normal breath sounds bilaterally, no wheezing, rales,rhonchi or crepitation. No use of accessory muscles of respiration.  CARDIOVASCULAR: S1, S2 normal. No murmurs, rubs, or gallops.  ABDOMEN: Soft, non-tender, non-distended. Bowel sounds present. No organomegaly or mass.  NEUROLOGIC: Awake and lying in the bed PSYCHIATRIC: The patient is alert and oriented x 1 SKIN: No obvious rash, lesion, or ulcer.   DATA REVIEW:   CBC Recent Labs  Lab 02/08/18 0358  WBC 13.6*  HGB 10.0*  HCT 30.6*  PLT 96*    Chemistries  Recent Labs  Lab 02/08/18 0358 02/09/18 0438  NA 141 145  K 5.2* 5.1  CL 106 110  CO2 24 27  GLUCOSE 130* 137*  BUN 85* 92*  CREATININE 2.05* 1.90*  CALCIUM 8.7* 8.7*  MG 2.9*  --   AST 62*  --   ALT 67*  --   ALKPHOS 83  --   BILITOT 1.7*  --     Cardiac Enzymes Recent Labs  Lab 02/06/18 0130  TROPONINI 0.11*    Microbiology Results  Results for orders placed or performed during the hospital encounter of 02/05/18  Urine culture     Status: None   Collection Time: 02/05/18  1:16 PM  Result Value Ref Range Status  Specimen Description   Final    URINE, RANDOM Performed at [facility], [address] [place], [place] [address]    Special Requests   Final    NONE Performed at [facility], [address] [place], [place] [address]    Culture   Final    NO GROWTH Performed at [facility], [address] [place], [place] [address]    Report Status 02/07/2018 FINAL  Final  Blood Culture (routine x 2)     Status: None   Collection Time: 02/05/18  1:41 PM  Result Value Ref Range Status   Specimen Description BLOOD LEFT HAND  Final   Special Requests   Final    BOTTLES DRAWN AEROBIC AND ANAEROBIC Blood Culture adequate volume   Culture   Final    NO GROWTH 5 DAYS Performed at [facility], [address] [place], [place] [address]    Report Status 02/10/2018 FINAL  Final  Blood  Culture (routine x 2)     Status: None   Collection Time: 02/05/18  1:41 PM  Result Value Ref Range Status   Specimen Description BLOOD RIGHT ARM  Final   Special Requests   Final    BOTTLES DRAWN AEROBIC AND ANAEROBIC Blood Culture adequate volume   Culture   Final    NO GROWTH 5 DAYS Performed at [facility], [address], [place], [place] [address]    Report Status 02/10/2018 FINAL  Final    RADIOLOGY:  No results found.  Follow up with PCP in 1 week.  Management plans discussed with the patient, family and they are in agreement.  CODE STATUS: DNR    Code Status Orders  (From admission, onward)         Start     Ordered   02/09/18 1132  Do not attempt resuscitation (DNR)  Continuous    Question Answer Comment  In the event of cardiac or respiratory ARREST Do not call a "code blue"   In the event of cardiac or respiratory ARREST Do not perform Intubation, CPR, defibrillation or ACLS   In the event of cardiac or respiratory ARREST Use medication by any route, position, wound care, and other measures to relive pain and suffering. May use oxygen, suction and manual treatment of airway obstruction as needed for comfort.   Comments Pt. to transition to comfort care/hospice      02/09/18 1132        Code Status History    Date Active Date Inactive Code Status Order ID Comments User Context   02/08/2018 1700 02/09/2018 1132 DNR [id number]  Salena Saner, MD Inpatient   02/05/2018 1512 02/08/2018 1700 DNR [id number]  Eugenie Norrie, NP Inpatient   02/05/2018 1355 02/05/2018 1512 Partial Code [id number]  Duane Austin, MD ED   02/05/2018 1347 02/05/2018 1355 Partial Code [id number]  Duane Austin, MD ED   02/05/2018 1346 02/05/2018 1347 DNR [id number]  Duane Austin, MD ED   01/31/2018 2022 02/02/2018 1712 DNR [id number]  Shaune Pollack, MD Inpatient    Advance Directive Documentation     Most Recent Value  Type of Advance Directive  Out of facility DNR (pink  MOST or yellow form)  Pre-existing out of facility DNR order (yellow form or pink MOST form)  Yellow form placed in chart (order not valid for inpatient use)  "MOST" Form in Place?  -      TOTAL TIME TAKING CARE OF THIS PATIENT  ON DAY OF DISCHARGE: more than 34 minutes.   Duane Turner M.D on 02/10/2018 at 10:46 AM  Between 7am to 6pm - Pager - [phone]  After 6pm go to [email or web address] - password [email or web address] [facility]  [facility] [place] [facility]  Office  [phone]  CC: Primary care physician; Duane Regulus, MD  Note: This dictation was prepared with Dragon dictation along with smaller phrase technology. Any transcriptional errors that result from this process are unintentional.

## 2018-02-10 NOTE — Care Management Important Message (Signed)
Important Message  Patient Details  Name: Duane Turner MRN: [id number] Date of Birth: 05-07-1932   Medicare Important Message Given:  Other (see comment) Patient sleeping.   Olegario Messier A [name] 02/10/2018, 11:22 AM

## 2018-02-10 NOTE — Progress Notes (Signed)
New hospice home referral received over the weekend. Patient is currently followed by outpatient Palliative at [facility].  Follow up visit made. Patient seen lying in bed, 45 second periods of apnea noted. Patient did awaken to voice and gentle tactile stimulation. He denied pain, mouth dry, mouth care given. Patient did suck on the mouth swab, teaspoon of water given, patient did swallow, but cleared his throat for several minutes afterward. He is not eating and has not been taking oral medications. Plan is for transfer to the hospice home today. Writer spoke via telephone to patient's son Duane Turner, he will go directly to the Hospice home to sign consents. Patient to be transferred via EMS when consents are signed. Updated notes faxed to referral, report called to the hospice home. Writer to contact EMS for transport. Signed DNR in place. Thank you for the opportunity to be involved in the care of this patient and his family. Dayna Barker RN, BSN, [facility] [place], hospital Liaison [phone]

## 2018-04-06 ENCOUNTER — Emergency Department

## 2018-04-06 ENCOUNTER — Encounter: Payer: Self-pay | Admitting: Emergency Medicine

## 2018-04-06 ENCOUNTER — Other Ambulatory Visit
Admission: RE | Admit: 2018-04-06 | Discharge: 2018-04-06 | Disposition: A | Discharge status: Home / Self Care | Payer: Medicare Other | Source: Ambulatory Visit | Attending: Family Medicine | Admitting: Family Medicine

## 2018-04-06 ENCOUNTER — Inpatient Hospital Stay
Admission: EM | Admit: 2018-04-06 | Discharge: 2018-04-08 | DRG: 637 | Disposition: A | Discharge status: Skilled Nursing Facility | Source: Skilled Nursing Facility | Attending: Internal Medicine | Admitting: Internal Medicine

## 2018-04-06 ENCOUNTER — Other Ambulatory Visit: Payer: Self-pay

## 2018-04-06 DIAGNOSIS — I5022 Chronic systolic (congestive) heart failure: Secondary | ICD-10-CM | POA: Diagnosis present

## 2018-04-06 DIAGNOSIS — M1990 Unspecified osteoarthritis, unspecified site: Secondary | ICD-10-CM | POA: Diagnosis present

## 2018-04-06 DIAGNOSIS — I130 Hypertensive heart and chronic kidney disease with heart failure and stage 1 through stage 4 chronic kidney disease, or unspecified chronic kidney disease: Secondary | ICD-10-CM | POA: Diagnosis present

## 2018-04-06 DIAGNOSIS — F0390 Unspecified dementia without behavioral disturbance: Secondary | ICD-10-CM | POA: Diagnosis present

## 2018-04-06 DIAGNOSIS — L8990 Pressure ulcer of unspecified site, unspecified stage: Secondary | ICD-10-CM

## 2018-04-06 DIAGNOSIS — E860 Dehydration: Secondary | ICD-10-CM | POA: Diagnosis present

## 2018-04-06 DIAGNOSIS — R4182 Altered mental status, unspecified: Secondary | ICD-10-CM | POA: Diagnosis present

## 2018-04-06 DIAGNOSIS — N189 Chronic kidney disease, unspecified: Secondary | ICD-10-CM | POA: Diagnosis present

## 2018-04-06 DIAGNOSIS — Z7902 Long term (current) use of antithrombotics/antiplatelets: Secondary | ICD-10-CM

## 2018-04-06 DIAGNOSIS — N179 Acute kidney failure, unspecified: Secondary | ICD-10-CM | POA: Diagnosis present

## 2018-04-06 DIAGNOSIS — E1122 Type 2 diabetes mellitus with diabetic chronic kidney disease: Secondary | ICD-10-CM | POA: Diagnosis present

## 2018-04-06 DIAGNOSIS — Z66 Do not resuscitate: Secondary | ICD-10-CM | POA: Diagnosis present

## 2018-04-06 DIAGNOSIS — N400 Enlarged prostate without lower urinary tract symptoms: Secondary | ICD-10-CM | POA: Diagnosis present

## 2018-04-06 DIAGNOSIS — Z794 Long term (current) use of insulin: Secondary | ICD-10-CM | POA: Diagnosis not present

## 2018-04-06 DIAGNOSIS — G9341 Metabolic encephalopathy: Secondary | ICD-10-CM | POA: Diagnosis present

## 2018-04-06 DIAGNOSIS — E1110 Type 2 diabetes mellitus with ketoacidosis without coma: Secondary | ICD-10-CM | POA: Diagnosis not present

## 2018-04-06 DIAGNOSIS — R739 Hyperglycemia, unspecified: Secondary | ICD-10-CM | POA: Diagnosis present

## 2018-04-06 DIAGNOSIS — E1100 Type 2 diabetes mellitus with hyperosmolarity without nonketotic hyperglycemic-hyperosmolar coma (NKHHC): Secondary | ICD-10-CM | POA: Diagnosis not present

## 2018-04-06 DIAGNOSIS — E871 Hypo-osmolality and hyponatremia: Secondary | ICD-10-CM | POA: Diagnosis present

## 2018-04-06 DIAGNOSIS — Z7982 Long term (current) use of aspirin: Secondary | ICD-10-CM

## 2018-04-06 DIAGNOSIS — E1301 Other specified diabetes mellitus with hyperosmolarity with coma: Secondary | ICD-10-CM

## 2018-04-06 LAB — COMPREHENSIVE METABOLIC PANEL
ALT: 25 U/L (ref 0–44)
AST: 29 U/L (ref 15–41)
Albumin: 3.1 g/dL — ABNORMAL LOW (ref 3.5–5.0)
Alkaline Phosphatase: 173 U/L — ABNORMAL HIGH (ref 38–126)
Anion gap: 17 — ABNORMAL HIGH (ref 5–15)
BUN: 72 mg/dL — ABNORMAL HIGH (ref 8–23)
CO2: 28 mmol/L (ref 22–32)
Calcium: 8.6 mg/dL — ABNORMAL LOW (ref 8.9–10.3)
Chloride: 87 mmol/L — ABNORMAL LOW (ref 98–111)
Creatinine, Ser: 2.43 mg/dL — ABNORMAL HIGH (ref 0.61–1.24)
GFR calc Af Amer: 27 mL/min — ABNORMAL LOW (ref >60)
GFR, EST NON AFRICAN AMERICAN: 24 mL/min — AB (ref >60)
Glucose, Bld: 1360 mg/dL (ref 70–99)
Potassium: 3.8 mmol/L (ref 3.5–5.1)
Sodium: 132 mmol/L — ABNORMAL LOW (ref 135–145)
Total Bilirubin: 0.7 mg/dL (ref 0.3–1.2)
Total Protein: 7 g/dL (ref 6.5–8.1)

## 2018-04-06 LAB — CBC WITH DIFFERENTIAL/PLATELET
Abs Immature Granulocytes: 0.04 10*3/uL (ref 0.00–0.07)
Abs Immature Granulocytes: 0.03 10*3/uL (ref 0.00–0.07)
Basophils Absolute: 0 10*3/uL (ref 0.0–0.1)
Basophils Absolute: 0 10*3/uL (ref 0.0–0.1)
Basophils Relative: 0 %
Basophils Relative: 0 %
EOS ABS: 0.2 10*3/uL (ref 0.0–0.5)
EOS PCT: 3 %
Eosinophils Absolute: 0.2 10*3/uL (ref 0.0–0.5)
Eosinophils Relative: 2 %
HCT: 40.1 % (ref 39.0–52.0)
HCT: 38 % — ABNORMAL LOW (ref 39.0–52.0)
Hemoglobin: 12 g/dL — ABNORMAL LOW (ref 13.0–17.0)
Hemoglobin: 11.6 g/dL — ABNORMAL LOW (ref 13.0–17.0)
Immature Granulocytes: 1 %
Immature Granulocytes: 0 %
Lymphocytes Relative: 9 %
Lymphocytes Relative: 11 %
Lymphs Abs: 0.6 10*3/uL — ABNORMAL LOW (ref 0.7–4.0)
Lymphs Abs: 0.8 10*3/uL (ref 0.7–4.0)
MCH: 28.8 pg (ref 26.0–34.0)
MCH: 29.2 pg (ref 26.0–34.0)
MCHC: 29.9 g/dL — ABNORMAL LOW (ref 30.0–36.0)
MCHC: 30.5 g/dL (ref 30.0–36.0)
MCV: 96.4 fL (ref 80.0–100.0)
MCV: 95.7 fL (ref 80.0–100.0)
Monocytes Absolute: 0.5 10*3/uL (ref 0.1–1.0)
Monocytes Absolute: 0.4 10*3/uL (ref 0.1–1.0)
Monocytes Relative: 7 %
Monocytes Relative: 6 %
Neutro Abs: 5.6 10*3/uL (ref 1.7–7.7)
Neutro Abs: 5.8 10*3/uL (ref 1.7–7.7)
Neutrophils Relative %: 81 %
Neutrophils Relative %: 80 %
Platelets: 239 10*3/uL (ref 150–400)
Platelets: 247 10*3/uL (ref 150–400)
RBC: 4.16 MIL/uL — ABNORMAL LOW (ref 4.22–5.81)
RBC: 3.97 MIL/uL — ABNORMAL LOW (ref 4.22–5.81)
RDW: 13.5 % (ref 11.5–15.5)
RDW: 13.4 % (ref 11.5–15.5)
WBC: 6.9 10*3/uL (ref 4.0–10.5)
WBC: 7.2 10*3/uL (ref 4.0–10.5)
nRBC: 0 % (ref 0.0–0.2)
nRBC: 0 % (ref 0.0–0.2)

## 2018-04-06 LAB — BASIC METABOLIC PANEL
Anion gap: 15 (ref 5–15)
Anion gap: 14 (ref 5–15)
BUN: 72 mg/dL — AB (ref 8–23)
BUN: 69 mg/dL — ABNORMAL HIGH (ref 8–23)
CO2: 29 mmol/L (ref 22–32)
CO2: 28 mmol/L (ref 22–32)
CREATININE: 2.55 mg/dL — AB (ref 0.61–1.24)
CREATININE: 2.5 mg/dL — AB (ref 0.61–1.24)
Calcium: 8.7 mg/dL — ABNORMAL LOW (ref 8.9–10.3)
Calcium: 8.4 mg/dL — ABNORMAL LOW (ref 8.9–10.3)
Chloride: 94 mmol/L — ABNORMAL LOW (ref 98–111)
Chloride: 100 mmol/L (ref 98–111)
GFR calc Af Amer: 26 mL/min — ABNORMAL LOW (ref >60)
GFR calc Af Amer: 27 mL/min — ABNORMAL LOW (ref >60)
GFR calc non Af Amer: 22 mL/min — ABNORMAL LOW (ref >60)
GFR calc non Af Amer: 23 mL/min — ABNORMAL LOW (ref >60)
Glucose, Bld: 1123 mg/dL (ref 70–99)
Glucose, Bld: 656 mg/dL (ref 70–99)
POTASSIUM: 3.4 mmol/L — AB (ref 3.5–5.1)
Potassium: 3.5 mmol/L (ref 3.5–5.1)
Sodium: 138 mmol/L (ref 135–145)
Sodium: 142 mmol/L (ref 135–145)

## 2018-04-06 LAB — PHOSPHORUS: Phosphorus: 3.5 mg/dL (ref 2.5–4.6)

## 2018-04-06 LAB — BASIC METABOLIC PANEL WITH GFR
Anion gap: 15 (ref 5–15)
BUN: 73 mg/dL — ABNORMAL HIGH (ref 8–23)
CO2: 32 mmol/L (ref 22–32)
Calcium: 9.2 mg/dL (ref 8.9–10.3)
Chloride: 84 mmol/L — ABNORMAL LOW (ref 98–111)
Creatinine, Ser: 2.36 mg/dL — ABNORMAL HIGH (ref 0.61–1.24)
GFR calc Af Amer: 28 mL/min — ABNORMAL LOW
GFR calc non Af Amer: 25 mL/min — ABNORMAL LOW
Glucose, Bld: 1497 mg/dL (ref 70–99)
Potassium: 4.5 mmol/L (ref 3.5–5.1)
Sodium: 131 mmol/L — ABNORMAL LOW (ref 135–145)

## 2018-04-06 LAB — URINALYSIS, COMPLETE (UACMP) WITH MICROSCOPIC
Bilirubin Urine: NEGATIVE
Glucose, UA: >=500 mg/dL — AB
Ketones, ur: NEGATIVE mg/dL
Nitrite: NEGATIVE
PROTEIN: 100 mg/dL — AB
Specific Gravity, Urine: 1.015 (ref 1.005–1.030)
WBC, UA: >50 WBC/hpf — ABNORMAL HIGH (ref 0–5)
pH: 5 (ref 5.0–8.0)

## 2018-04-06 LAB — CBC
HCT: 36.3 % — ABNORMAL LOW (ref 39.0–52.0)
Hemoglobin: 11.1 g/dL — ABNORMAL LOW (ref 13.0–17.0)
MCH: 28.5 pg (ref 26.0–34.0)
MCHC: 30.6 g/dL (ref 30.0–36.0)
MCV: 93.3 fL (ref 80.0–100.0)
Platelets: 230 10*3/uL (ref 150–400)
RBC: 3.89 MIL/uL — ABNORMAL LOW (ref 4.22–5.81)
RDW: 13.2 % (ref 11.5–15.5)
WBC: 7.1 10*3/uL (ref 4.0–10.5)
nRBC: 0 % (ref 0.0–0.2)

## 2018-04-06 LAB — MRSA PCR SCREENING: MRSA by PCR: NEGATIVE

## 2018-04-06 LAB — MAGNESIUM: MAGNESIUM: 2.5 mg/dL — AB (ref 1.7–2.4)

## 2018-04-06 MED ORDER — SODIUM CHLORIDE 0.9 % IV SOLN
INTRAVENOUS | Status: DC
  Administered 2018-04-06: 18:00:00 via INTRAVENOUS

## 2018-04-06 MED ORDER — SODIUM CHLORIDE 0.9 % IV BOLUS: 1000.0000 mL | INTRAVENOUS | Status: DC

## 2018-04-06 MED ORDER — INSULIN REGULAR BOLUS VIA INFUSION
0.0000 [IU] | Freq: Three times a day (TID) | INTRAVENOUS | Status: DC
  Filled 2018-04-06: qty 10

## 2018-04-06 MED ORDER — SODIUM CHLORIDE 0.9 % IV BOLUS
1000.0000 mL | Freq: Once | INTRAVENOUS | Status: AC
  Administered 2018-04-06: 1000 mL via INTRAVENOUS

## 2018-04-06 MED ORDER — SODIUM CHLORIDE 0.9 % IV BOLUS
1000.0000 mL | INTRAVENOUS | Status: AC
  Administered 2018-04-07 (×2): 1000 mL via INTRAVENOUS

## 2018-04-06 MED ORDER — POTASSIUM CHLORIDE 10 MEQ/100ML IV SOLN
10.0000 meq | INTRAVENOUS | Status: AC
  Administered 2018-04-06 (×2): 10 meq via INTRAVENOUS
  Filled 2018-04-06 (×2): qty 100

## 2018-04-06 MED ORDER — HEPARIN SODIUM (PORCINE) 5000 UNIT/ML IJ SOLN
5000.0000 [IU] | Freq: Three times a day (TID) | INTRAMUSCULAR | Status: DC
  Administered 2018-04-06 – 2018-04-08 (×5): 5000 [IU] via SUBCUTANEOUS
  Filled 2018-04-06 (×5): qty 1

## 2018-04-06 MED ORDER — INSULIN REGULAR(HUMAN) IN NACL 100-0.9 UT/100ML-% IV SOLN
INTRAVENOUS | Status: DC
  Administered 2018-04-06: 5.4 [IU]/h via INTRAVENOUS
  Filled 2018-04-06: qty 100

## 2018-04-06 MED ORDER — DEXTROSE-NACL 5-0.45 % IV SOLN: INTRAVENOUS | Status: DC

## 2018-04-06 MED ORDER — INSULIN REGULAR(HUMAN) IN NACL 100-0.9 UT/100ML-% IV SOLN
INTRAVENOUS | Status: DC
  Administered 2018-04-06: 27.1 [IU]/h via INTRAVENOUS
  Filled 2018-04-06: qty 100

## 2018-04-06 MED ORDER — SODIUM CHLORIDE 0.9 % IV SOLN
INTRAVENOUS | Status: AC
  Administered 2018-04-06: 18:00:00 via INTRAVENOUS

## 2018-04-06 MED ORDER — DEXTROSE 50 % IV SOLN: 25.0000 mL | INTRAVENOUS | Status: DC | PRN

## 2018-04-06 MED ORDER — SODIUM CHLORIDE 0.9 % IV SOLN: INTRAVENOUS | Status: DC

## 2018-04-06 NOTE — ED Notes (Signed)
Blood sugar still reading high - glucostabilizer increased to 10.8

## 2018-04-06 NOTE — Consult Note (Addendum)
PULMONARY / CRITICAL CARE MEDICINE  Name: Duane Turner MRN: [id number] DOB: 16-Dec-1932    LOS: 0  Referring Provider: Dr. Tobi Bastos Reason for Referral: Severe hyperglycemia Brief patient description: 84 year old nursing home patient currently on hospice care who presented to the ED with severe hypoglycemia.  He is being admitted to the ICU for IV insulin  HPI: This is an 84 year old male with a medical history as indicated below, current nursing home resident, chronic Foley who presented to the ED with severe hyperglycemia.  Initial blood sugar at the nursing home was 1497 mg/dL.  At the ED, his blood glucose was 1360 mg/dL.  His creatinine is also up to 2.43 from his baseline of 1.9.  Rest of his ED work-up was unremarkable.  He is being admitted to the ICU for IV insulin therapy.  He is a DNR and was on hospice care prior to ED presentation.  Plan is to correct hyperglycemia and discharge patient back to hospice.  Past Medical History:  Diagnosis Date  . Anemia   . Arthritis   . BPH (benign prostatic hyperplasia)   . Chronic systolic heart failure (HCC)   . Diabetes mellitus without complication (HCC)   . Dysphagia   . Hyperaldosteronism (HCC)   . Hypertension   . Renal disorder    History reviewed. No pertinent surgical history. Prior to Admission medications   Medication Sig Start Date End Date Taking? Authorizing Provider  amLODipine (NORVASC) 5 MG tablet Take 5 mg by mouth daily.   Yes [provider]  clopidogrel (PLAVIX) 75 MG tablet Take 75 mg by mouth daily.   Yes [provider]  donepezil (ARICEPT) 5 MG tablet Take 1 tablet (5 mg total) by mouth at bedtime. 12/16/17 01/25/18 Yes Sowles, Danna Hefty, MD  empagliflozin (JARDIANCE) 25 MG TABS tablet Take 25 mg by mouth daily.   Yes [provider]  glycopyrrolate (ROBINUL) 1 MG tablet Take 1 mg by mouth 2 (two) times daily.   Yes [provider]  insulin aspart (NOVOLOG FLEXPEN) 100 UNIT/ML  FlexPen Inject 12 Units into the skin 2 (two) times daily.   Yes [provider]  insulin aspart (NOVOLOG) 100 UNIT/ML FlexPen Inject 18 Units into the skin daily. At 1700   Yes [provider]  Insulin Degludec-Liraglutide (XULTOPHY) 100-3.6 UNIT-MG/ML SOPN Inject 50 Units into the skin daily.   Yes [provider]  levETIRAcetam (KEPPRA) 500 MG tablet Take 500 mg by mouth 2 (two) times daily.   Yes [provider]  lipase/protease/amylase (CREON) 12000 units CPEP capsule Take 6,000 Units by mouth 3 (three) times daily before meals.   Yes [provider]  lipase/protease/amylase (CREON) 12000 units CPEP capsule Take 3,000 Units by mouth at bedtime. With snack   Yes [provider]  lisinopril (PRINIVIL,ZESTRIL) 5 MG tablet Take 5 mg by mouth daily.   Yes [provider]  metoprolol succinate (TOPROL-XL) 25 MG 24 hr tablet Take 1 tablet (25 mg total) by mouth daily. 12/16/17  Yes Sowles, Danna Hefty, MD  rosuvastatin (CRESTOR) 40 MG tablet Take 1 tablet (40 mg total) by mouth daily. 12/16/17 01/25/18 Yes Alba Cory, MD  aspirin EC 81 MG tablet Take 81 mg by mouth daily.    [provider]  famotidine (PEPCID) 20 MG tablet Take 1 tablet (20 mg total) by mouth 2 (two) times daily. 12/16/17 01/15/18  Alba Cory, MD  gabapentin (NEURONTIN) 300 MG capsule Take 1 capsule (300 mg total) by mouth 2 (two) times daily.  12/16/17 01/15/18  Alba CorySowles, Krichna, MD  insulin glargine (LANTUS) 100 UNIT/ML injection Inject 0.1 mLs (10 Units total) into the skin daily. 12/16/17 01/15/18  Alba CorySowles, Krichna, MD  lacosamide 100 MG TABS Take 1 tablet (100 mg total) by mouth 2 (two) times daily. Patient not taking: Reported on 01/25/2018 05/03/17   Enedina FinnerPatel, Sona, MD  promethazine (PHENERGAN) 12.5 MG tablet Take 1 tablet (12.5 mg total) by mouth every 6 (six) hours as needed for nausea or vomiting. Patient not taking: Reported on 01/25/2018 02/19/17   Almond LintByerly, Faera,  MD  sertraline (ZOLOFT) 25 MG tablet Take 1 tablet (25 mg total) by mouth daily. Patient not taking: Reported on 01/25/2018 12/16/17   Alba CorySowles, Krichna, MD   Allergies No Known Allergies  Family History History reviewed. No pertinent family history. Social History  reports that he has never smoked. He has never used smokeless tobacco. He reports previous alcohol use. He reports previous drug use.  Review Of Systems: Unable to obtain as patient is confused  VITAL SIGNS: BP (!) 85/75   Pulse 75   Temp (!) 91.8 F (33.2 C)   Resp (!) 27   Ht 5\' 5"  (1.651 m)   Wt 58.4 kg   SpO2 97%   BMI 21.42 kg/m   HEMODYNAMICS:    VENTILATOR SETTINGS:    INTAKE / OUTPUT: No intake/output data recorded.  PHYSICAL EXAMINATION: General: Chronically ill looking, in no distress HEENT: PERRLA, trachea midline, no JVD Neuro: Awake and alert, follows some basic commands, confused, moves all extremities Cardiovascular: AP regular, S1/S2, no MRG, +2 pulses bilaterally Lungs: Lateral breath sounds, diminished in the bases with mild diffuse Abdomen: Nondistended, normal bowel sounds GU: Foley in place, draining cloudy urine Musculoskeletal: Positive range of motion, no joint deformities Skin: Warm and dry  LABS:  BMET Recent Labs  Lab 04/06/18 1115 04/06/18 1503  NA 131* 132*  K 4.5 3.8  CL 84* 87*  CO2 32 28  BUN 73* 72*  CREATININE 2.36* 2.43*  GLUCOSE 1,497* 1,360*    Electrolytes Recent Labs  Lab 04/06/18 1115 04/06/18 1503  CALCIUM 9.2 8.6*    CBC Recent Labs  Lab 04/06/18 1115 04/06/18 1503 04/06/18 1808  WBC 6.9 7.2 7.1  HGB 12.0* 11.6* 11.1*  HCT 40.1 38.0* 36.3*  PLT 239 247 230    Coag's No results for input(s): APTT, INR in the last 168 hours.  Sepsis Markers No results for input(s): LATICACIDVEN, PROCALCITON, O2SATVEN in the last 168 hours.  ABG No results for input(s): PHART, PCO2ART, PO2ART in the last 168 hours.  Liver Enzymes Recent Labs   Lab 04/06/18 1503  AST 29  ALT 25  ALKPHOS 173*  BILITOT 0.7  ALBUMIN 3.1*    Cardiac Enzymes No results for input(s): TROPONINI, PROBNP in the last 168 hours.  Glucose No results for input(s): GLUCAP in the last 168 hours.  Imaging Dg Chest 2 View  Result Date: 04/06/2018 CLINICAL DATA:  Hypoglycemia. History of diabetes, hypertension and congestive heart failure. EXAM: CHEST - 2 VIEW COMPARISON:  02/08/2018 and 02/07/2018 radiographs. FINDINGS: 1436 hours. The heart size and mediastinal contours are stable. There is mild aortic atherosclerosis. Right upper lobe and bibasilar pulmonary opacities present on the prior study have resolved. There is minimal bibasilar atelectasis. No edema, confluent airspace opacity, pleural effusion or pneumothorax. No acute osseous findings are evident. Several buttons overlie the upper chest. IMPRESSION: No active cardiopulmonary process. Electronically Signed   By: Carey BullocksWilliam  Veazey M.D.   On:  04/06/2018 15:13   Ct Head Wo Contrast  Result Date: 04/06/2018 CLINICAL DATA:  Altered mental status with decreased orientation. Hyperglycemia. Diabetes. EXAM: CT HEAD WITHOUT CONTRAST TECHNIQUE: Contiguous axial images were obtained from the base of the skull through the vertex without intravenous contrast. COMPARISON:  CT head 02/06/2018. FINDINGS: Images were repeated due to motion. Brain: There is no evidence of acute intracranial hemorrhage, mass lesion, brain edema or extra-axial fluid collection. There is stable atrophy with prominence of the ventricles and subarachnoid spaces. There is stable mild chronic small vessel ischemic change in the periventricular white matter bilaterally. There is no CT evidence of acute cortical infarction. Vascular: Intracranial vascular calcifications. No hyperdense vessel identified. Skull: Unremarkable. Sinuses/Orbits: Partial opacification of the posterior ethmoid air cells on the left is similar to the previous study. The  visualized paranasal sinuses, mastoid air cells and middle ears are otherwise clear. No orbital abnormalities are seen. Other: None. IMPRESSION: Stable head CT without acute intracranial findings. Stable atrophy and chronic small vessel ischemic changes. Electronically Signed   By: Carey Bullocks M.D.   On: 04/06/2018 15:29    STUDIES:  None  CULTURES: Blood cultures x2 Urine cultures  ANTIBIOTICS: None  SIGNIFICANT EVENTS: 04/06/2018: Admitted  LINES/TUBES: Peripheral IV  DISCUSSION: 84 year old male presenting with hyperosmolar state with mildly elevated AG( ketones pending), acute renal failure and hyponatremia  ASSESSMENT DKA Acute on chronic renal failure Hyponatremia Acute metabolic encephalopathy secondary to hyperglycemia Chronic Foley   PLAN Insulin per DKA protocol Trend creatinine Avoid nephrotoxic drugs Urine and blood cultures to rule out any precipitating infections Replace Foley  Best Practice: Code Status: DNR/DNI Diet: Clear liquids GI prophylaxis: Not indicated VTE prophylaxis: Subcu heparin  FAMILY  - Updates: No family at bedside.  Will update when available   S. [facility] ANP-BC Pulmonary and Critical Care Medicine [facility] Pager [phone] or [phone]  NB: This document was prepared using Dragon voice recognition software and may include unintentional dictation errors.    04/06/2018, 7:59 PM

## 2018-04-06 NOTE — H&P (Signed)
[facility] [place] [facility]   PATIENT NAME: Duane RilesJohn Turner    MR#:  [id number]  DATE OF BIRTH:  17-Mar-1933  DATE OF ADMISSION:  04/06/2018  PRIMARY CARE PHYSICIAN: Lauro RegulusAnderson, Marshall W, MD   REQUESTING/REFERRING PHYSICIAN:   CHIEF COMPLAINT:   Chief Complaint  Patient presents with  . Hyperglycemia    HISTORY OF PRESENT ILLNESS: Duane RilesJohn Turner  is a 84 y.o. male with a known history of hypertension, diabetes mellitus, hyperaldosteronism, arthritis, benign prostate hypertrophy was referred from facility for elevated blood sugar and confusion.  Patient blood sugar was more than 1400.  Anion gap was around 15.  Patient is dry and dehydrated.  Patient was worked up with CT head which showed no acute abnormality.  Patient unable to give much history has underlying dementia.  PAST MEDICAL HISTORY:   Past Medical History:  Diagnosis Date  . Anemia   . Arthritis   . BPH (benign prostatic hyperplasia)   . Chronic systolic heart failure (HCC)   . Diabetes mellitus without complication (HCC)   . Dysphagia   . Hyperaldosteronism (HCC)   . Hypertension   . Renal disorder     PAST SURGICAL HISTORY: None  SOCIAL HISTORY:  Social History   Tobacco Use  . Smoking status: Never Smoker  . Smokeless tobacco: Never Used  Substance Use Topics  . Alcohol use: Not Currently    FAMILY HISTORY: Mother and father deceased  DRUG ALLERGIES: No Known Allergies  REVIEW OF SYSTEMS:  Could not be obtained secondary to dementia and confusion MEDICATIONS AT HOME:  Prior to Admission medications   Not on File      PHYSICAL EXAMINATION:   VITAL SIGNS: Blood pressure 110/72, pulse 69, resp. rate 16, height 5\' 6"  (1.676 m), weight 68 kg, SpO2 100 %.  GENERAL:  84 y.o.-year-old patient lying in the bed EYES: Pupils equal, round, reactive to light and accommodation. No scleral icterus. Extraocular muscles intact.  HEENT: Head atraumatic, normocephalic. Oropharynx  dry and nasopharynx clear.  NECK:  Supple, no jugular venous distention. No thyroid enlargement, no tenderness.  LUNGS: Normal breath sounds bilaterally, no wheezing, rales,rhonchi or crepitation. No use of accessory muscles of respiration.  CARDIOVASCULAR: S1, S2 normal. No murmurs, rubs, or gallops.  ABDOMEN: Soft, nontender, nondistended. Bowel sounds present. No organomegaly or mass.  EXTREMITIES: No pedal edema, cyanosis, or clubbing.  NEUROLOGIC: Cranial nerves II through XII are intact. Muscle strength 5/5 in all extremities. Sensation intact. Gait not checked.  PSYCHIATRIC: The patient is alert and oriented x none.  SKIN: No obvious rash, lesion, or ulcer.   LABORATORY PANEL:   CBC Recent Labs  Lab 04/06/18 1115 04/06/18 1503  WBC 6.9 7.2  HGB 12.0* 11.6*  HCT 40.1 38.0*  PLT 239 247  MCV 96.4 95.7  MCH 28.8 29.2  MCHC 29.9* 30.5  RDW 13.5 13.4  LYMPHSABS 0.6* 0.8  MONOABS 0.5 0.4  EOSABS 0.2 0.2  BASOSABS 0.0 0.0   ------------------------------------------------------------------------------------------------------------------  Chemistries  Recent Labs  Lab 04/06/18 1115  NA 131*  K 4.5  CL 84*  CO2 32  GLUCOSE 1,497*  BUN 73*  CREATININE 2.36*  CALCIUM 9.2   ------------------------------------------------------------------------------------------------------------------ estimated creatinine clearance is 21.4 mL/min (A) (by C-G formula based on SCr of 2.36 mg/dL (H)). ------------------------------------------------------------------------------------------------------------------ No results for input(s): TSH, T4TOTAL, T3FREE, THYROIDAB in the last 72 hours.  Invalid input(s): FREET3   Coagulation profile No results for input(s): INR, PROTIME in the last 168 hours. -------------------------------------------------------------------------------------------------------------------  No results for input(s): DDIMER in the last 72  hours. -------------------------------------------------------------------------------------------------------------------  Cardiac Enzymes No results for input(s): CKMB, TROPONINI, MYOGLOBIN in the last 168 hours.  Invalid input(s): CK ------------------------------------------------------------------------------------------------------------------ Invalid input(s): POCBNP  ---------------------------------------------------------------------------------------------------------------  Urinalysis    Component Value Date/Time   COLORURINE YELLOW (A) 02/05/2018 1316   APPEARANCEUR CLEAR (A) 02/05/2018 1316   LABSPEC 1.008 02/05/2018 1316   PHURINE 5.0 02/05/2018 1316   GLUCOSEU NEGATIVE 02/05/2018 1316   HGBUR NEGATIVE 02/05/2018 1316   BILIRUBINUR NEGATIVE 02/05/2018 1316   KETONESUR NEGATIVE 02/05/2018 1316   PROTEINUR NEGATIVE 02/05/2018 1316   UROBILINOGEN 0.2 10/14/2006 0303   NITRITE NEGATIVE 02/05/2018 1316   LEUKOCYTESUR TRACE (A) 02/05/2018 1316     RADIOLOGY: Dg Chest 2 View  Result Date: 04/06/2018 CLINICAL DATA:  Hypoglycemia. History of diabetes, hypertension and congestive heart failure. EXAM: CHEST - 2 VIEW COMPARISON:  02/08/2018 and 02/07/2018 radiographs. FINDINGS: 1436 hours. The heart size and mediastinal contours are stable. There is mild aortic atherosclerosis. Right upper lobe and bibasilar pulmonary opacities present on the prior study have resolved. There is minimal bibasilar atelectasis. No edema, confluent airspace opacity, pleural effusion or pneumothorax. No acute osseous findings are evident. Several buttons overlie the upper chest. IMPRESSION: No active cardiopulmonary process. Electronically Signed   By: Carey Bullocks M.D.   On: 04/06/2018 15:13   Ct Head Wo Contrast  Result Date: 04/06/2018 CLINICAL DATA:  Altered mental status with decreased orientation. Hyperglycemia. Diabetes. EXAM: CT HEAD WITHOUT CONTRAST TECHNIQUE: Contiguous axial images  were obtained from the base of the skull through the vertex without intravenous contrast. COMPARISON:  CT head 02/06/2018. FINDINGS: Images were repeated due to motion. Brain: There is no evidence of acute intracranial hemorrhage, mass lesion, brain edema or extra-axial fluid collection. There is stable atrophy with prominence of the ventricles and subarachnoid spaces. There is stable mild chronic small vessel ischemic change in the periventricular white matter bilaterally. There is no CT evidence of acute cortical infarction. Vascular: Intracranial vascular calcifications. No hyperdense vessel identified. Skull: Unremarkable. Sinuses/Orbits: Partial opacification of the posterior ethmoid air cells on the left is similar to the previous study. The visualized paranasal sinuses, mastoid air cells and middle ears are otherwise clear. No orbital abnormalities are seen. Other: None. IMPRESSION: Stable head CT without acute intracranial findings. Stable atrophy and chronic small vessel ischemic changes. Electronically Signed   By: Carey Bullocks M.D.   On: 04/06/2018 15:29    EKG: Orders placed or performed during the hospital encounter of 04/06/18  . ED EKG  . ED EKG    IMPRESSION AND PLAN:  83 year old elderly male patient with history of chronic systolic heart failure, diabetes mellitus, hypertension, arthritis, hyperaldosteronism presented to the emergency room after being referred from facility for elevated blood sugar and confusion  -Nonketotic hyperglycemic hyperosmolar state Admit patient to ICU IV insulin drip for control of blood sugars IV fluid hydration Monitor blood sugars closely Keep patient n.p.o. Intensivist consultation  -Chronic systolic heart failure Stable  -Hyponatremia probably secondary to dehydration IV fluids  -DVT prophylaxis With subcu heparin  All the records are reviewed and case discussed with ED provider. Management plans discussed with the patient, family  and they are in agreement.  CODE STATUS:DNR Code Status History    Date Active Date Inactive Code Status Order ID Comments User Context   02/09/2018 1132 02/10/2018 1806 DNR [id number]  Milagros Loll, MD Inpatient   02/08/2018 1700 02/09/2018 1132 DNR [id number]  Salena Saner, MD  Inpatient   02/05/2018 1512 02/08/2018 1700 DNR [id number]  Eugenie Norrie, NP Inpatient   02/05/2018 1355 02/05/2018 1512 Partial Code [id number]  Ihor Austin, MD ED   02/05/2018 1347 02/05/2018 1355 Partial Code [id number]  Ihor Austin, MD ED   02/05/2018 1346 02/05/2018 1347 DNR [id number]  Ihor Austin, MD ED   01/31/2018 2022 02/02/2018 1712 DNR [id number]  Shaune Pollack, MD Inpatient    Questions for Most Recent Historical Code Status (Order [id number])    Question Answer Comment   In the event of cardiac or respiratory ARREST Do not call a "code blue"    In the event of cardiac or respiratory ARREST Do not perform Intubation, CPR, defibrillation or ACLS    In the event of cardiac or respiratory ARREST Use medication by any route, position, wound care, and other measures to relive pain and suffering. May use oxygen, suction and manual treatment of airway obstruction as needed for comfort.    Comments Pt. to transition to comfort care/hospice         Advance Directive Documentation     Most Recent Value  Type of Advance Directive  Out of facility DNR (pink MOST or yellow form)  Pre-existing out of facility DNR order (yellow form or pink MOST form)  -  "MOST" Form in Place?  -       TOTAL CRITICAL CARE TIME TAKING CARE OF THIS PATIENT: 55 minutes.    Ihor Austin M.D on 04/06/2018 at 4:49 PM  Between 7am to 6pm - Pager - [phone]  After 6pm go to [email or web address] - password [email or web address] [facility]  [facility]  Office  [phone]  CC: Primary care physician; Lauro Regulus, MD

## 2018-04-06 NOTE — ED Triage Notes (Signed)
Pt had labs drawn today for decreased orientation. bs came back at 1497. Given 8 units regular insulin by nh.

## 2018-04-06 NOTE — ED Provider Notes (Signed)
[facility] Emergency Department Provider Note   ____________________________________________   First MD Initiated Contact with Patient 04/06/18 1431     (approximate)  I have reviewed the triage vital signs and the nursing notes.   HISTORY  Chief Complaint Hyperglycemia History limited by altered mental status  HPI Duane Turner is a 84 y.o. male patient at the nursing home.  His blood sugar there was 14  00+.  He received 8 units of insulin there and was transferred here.  EMS began giving him IV fluids.  Patient is somewhat confused and cannot give a good history Patient son is here but he cannot tell me what medications the patient uses Past Medical History:  Diagnosis Date  . Anemia   . Arthritis   . BPH (benign prostatic hyperplasia)   . Chronic systolic heart failure (HCC)   . Diabetes mellitus without complication (HCC)   . Dysphagia   . Hyperaldosteronism (HCC)   . Hypertension   . Renal disorder     Patient Active Problem List   Diagnosis Date Noted  . Sepsis (HCC) 02/05/2018  . Hyperkalemia 01/31/2018  . DIABETES MELLITUS, TYPE II, UNCONTROLLED 05/17/2009  . HYPERTENSION, BENIGN ESSENTIAL 05/17/2009  . TOBACCO USER 01/29/2009  . ERECTILE DYSFUNCTION 12/23/2007  . HYPERLIPIDEMIA 11/29/2006  . ANEMIA NOS 11/01/2006  . PROTEINURIA, MILD 11/01/2006    History reviewed. No pertinent surgical history.  Prior to Admission medications   Not on File    Allergies Patient has no known allergies.  History reviewed. No pertinent family history.  Social History Social History   Tobacco Use  . Smoking status: Never Smoker  . Smokeless tobacco: Never Used  Substance Use Topics  . Alcohol use: Not Currently  . Drug use: Not Currently    Review of Systems  Unable to obtain    ____________________________________________   PHYSICAL EXAM:  VITAL SIGNS: ED Triage Vitals  Enc Vitals Group     BP 04/06/18 1436 97/82   Pulse Rate 04/06/18 1436 67     Resp 04/06/18 1436 16     Temp --      Temp src --      SpO2 04/06/18 1436 97 %     Weight 04/06/18 1437 150 lb (68 kg)     Height 04/06/18 1437 5\' 6"  (1.676 m)     Head Circumference --      Peak Flow --      Pain Score 04/06/18 1437 0     Pain Loc --      Pain Edu? --      Excl. in GC? --    Constitutional: Alert cannot tell if he is oriented he is asking for apple juice and when I tell him it will make his sugars go higher he says okay Eyes: Conjunctivae are normal. PER. EOMI. Head: Atraumatic. Nose: No congestion/rhinnorhea. Mouth/Throat: Mucous membranes are moist.  Oropharynx non-erythematous. Neck: No stridor.   Cardiovascular: Normal rate, regular rhythm. Grossly normal heart sounds.  Good peripheral circulation. Respiratory: Normal respiratory effort.  No retractions. Lungs CTAB. Gastrointestinal: Soft and nontender. No distention. No abdominal bruits. No CVA tenderness. Musculoskeletal: No lower extremity tenderness nor edema.  No joint effusions. Neurologic: Altered mental status patient appears to be moving his arms equally and well not moving his legs much Skin:  Skin is warm, dry and intact. No rash noted.  Patient reportedly has a decubitus on his buttocks he is currently in the hallway when you get  him in the room we will check   ____________________________________________   LABS (all labs ordered are listed, but only abnormal results are displayed)  Labs Reviewed  CBC WITH DIFFERENTIAL/PLATELET - Abnormal; Notable for the following components:      Result Value   RBC 3.97 (*)    Hemoglobin 11.6 (*)    HCT 38.0 (*)    All other components within normal limits  COMPREHENSIVE METABOLIC PANEL  URINALYSIS, COMPLETE (UACMP) WITH MICROSCOPIC  CBG MONITORING, ED   ____________________________________________  EKG  EKG read interpreted by me shows normal sinus rhythm rate of 71 left axis no acute ST-T wave changes EKG done  initially in the emergency room showed sinus rate at 70 left axis very irregular baseline unable to tell much of anything else the computer thought that he had third-degree block but I did not believe so.  The second EKG which I read first substantiates that believe ____________________________________________  RADIOLOGY  ED MD interpretation: X-ray read by radiology reviewed by me shows no acute disease CT the head read by radiology reviewed by me shows no acute disease Official radiology report(s): Dg Chest 2 View  Result Date: 04/06/2018 CLINICAL DATA:  Hypoglycemia. History of diabetes, hypertension and congestive heart failure. EXAM: CHEST - 2 VIEW COMPARISON:  02/08/2018 and 02/07/2018 radiographs. FINDINGS: 1436 hours. The heart size and mediastinal contours are stable. There is mild aortic atherosclerosis. Right upper lobe and bibasilar pulmonary opacities present on the prior study have resolved. There is minimal bibasilar atelectasis. No edema, confluent airspace opacity, pleural effusion or pneumothorax. No acute osseous findings are evident. Several buttons overlie the upper chest. IMPRESSION: No active cardiopulmonary process. Electronically Signed   By: Carey BullocksWilliam  Veazey M.D.   On: 04/06/2018 15:13   Ct Head Wo Contrast  Result Date: 04/06/2018 CLINICAL DATA:  Altered mental status with decreased orientation. Hyperglycemia. Diabetes. EXAM: CT HEAD WITHOUT CONTRAST TECHNIQUE: Contiguous axial images were obtained from the base of the skull through the vertex without intravenous contrast. COMPARISON:  CT head 02/06/2018. FINDINGS: Images were repeated due to motion. Brain: There is no evidence of acute intracranial hemorrhage, mass lesion, brain edema or extra-axial fluid collection. There is stable atrophy with prominence of the ventricles and subarachnoid spaces. There is stable mild chronic small vessel ischemic change in the periventricular white matter bilaterally. There is no CT  evidence of acute cortical infarction. Vascular: Intracranial vascular calcifications. No hyperdense vessel identified. Skull: Unremarkable. Sinuses/Orbits: Partial opacification of the posterior ethmoid air cells on the left is similar to the previous study. The visualized paranasal sinuses, mastoid air cells and middle ears are otherwise clear. No orbital abnormalities are seen. Other: None. IMPRESSION: Stable head CT without acute intracranial findings. Stable atrophy and chronic small vessel ischemic changes. Electronically Signed   By: Carey BullocksWilliam  Veazey M.D.   On: 04/06/2018 15:29    ____________________________________________   PROCEDURES  Procedure(s) performed:   Procedures  Critical Care performed: Medical care time 15 minutes this includes reviewing the patient's old records examining the patient reexamining the patient to check his decubitus discussing the patient with his son and the hospitalist.  ____________________________________________   INITIAL IMPRESSION / ASSESSMENT AND PLAN / ED COURSE  Patient is decubitus examined about the size of a $0.50 piece right over the middle of the sacrum.  There is a little bit of surrounding erythema not much no odor small white tissue in the middle of it about the size of a quarter.  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    Arnaldo Natal, MD 04/06/18 [phone]

## 2018-04-06 NOTE — ED Notes (Signed)
Report given.

## 2018-04-07 LAB — CBC
HEMATOCRIT: 30.5 % — AB (ref 39.0–52.0)
Hemoglobin: 9.9 g/dL — ABNORMAL LOW (ref 13.0–17.0)
MCH: 29 pg (ref 26.0–34.0)
MCHC: 32.5 g/dL (ref 30.0–36.0)
MCV: 89.4 fL (ref 80.0–100.0)
Platelets: 218 10*3/uL (ref 150–400)
RBC: 3.41 MIL/uL — ABNORMAL LOW (ref 4.22–5.81)
RDW: 13.2 % (ref 11.5–15.5)
WBC: 10 10*3/uL (ref 4.0–10.5)
nRBC: 0 % (ref 0.0–0.2)

## 2018-04-07 LAB — GLUCOSE, CAPILLARY
GLUCOSE-CAPILLARY: 126 mg/dL — AB (ref 70–99)
GLUCOSE-CAPILLARY: 162 mg/dL — AB (ref 70–99)
Glucose-Capillary: 113 mg/dL — ABNORMAL HIGH (ref 70–99)
Glucose-Capillary: 137 mg/dL — ABNORMAL HIGH (ref 70–99)
Glucose-Capillary: 141 mg/dL — ABNORMAL HIGH (ref 70–99)
Glucose-Capillary: 147 mg/dL — ABNORMAL HIGH (ref 70–99)
Glucose-Capillary: 195 mg/dL — ABNORMAL HIGH (ref 70–99)
Glucose-Capillary: 218 mg/dL — ABNORMAL HIGH (ref 70–99)
Glucose-Capillary: 242 mg/dL — ABNORMAL HIGH (ref 70–99)
Glucose-Capillary: 348 mg/dL — ABNORMAL HIGH (ref 70–99)
Glucose-Capillary: 468 mg/dL — ABNORMAL HIGH (ref 70–99)
Glucose-Capillary: 75 mg/dL (ref 70–99)
Glucose-Capillary: 79 mg/dL (ref 70–99)
Glucose-Capillary: 79 mg/dL (ref 70–99)
Glucose-Capillary: 81 mg/dL (ref 70–99)
Glucose-Capillary: >600 mg/dL (ref 70–99)
Glucose-Capillary: >600 mg/dL (ref 70–99)
Glucose-Capillary: >600 mg/dL (ref 70–99)
Glucose-Capillary: >600 mg/dL (ref 70–99)
Glucose-Capillary: >600 mg/dL (ref 70–99)
Glucose-Capillary: >600 mg/dL (ref 70–99)
Glucose-Capillary: >600 mg/dL (ref 70–99)

## 2018-04-07 LAB — MAGNESIUM: Magnesium: 2 mg/dL (ref 1.7–2.4)

## 2018-04-07 LAB — BASIC METABOLIC PANEL
Anion gap: 9 (ref 5–15)
BUN: 60 mg/dL — ABNORMAL HIGH (ref 8–23)
CO2: 26 mmol/L (ref 22–32)
Calcium: 8.1 mg/dL — ABNORMAL LOW (ref 8.9–10.3)
Chloride: 108 mmol/L (ref 98–111)
Creatinine, Ser: 1.98 mg/dL — ABNORMAL HIGH (ref 0.61–1.24)
GFR calc Af Amer: 35 mL/min — ABNORMAL LOW (ref >60)
GFR calc non Af Amer: 30 mL/min — ABNORMAL LOW (ref >60)
GLUCOSE: 90 mg/dL (ref 70–99)
Potassium: 3.6 mmol/L (ref 3.5–5.1)
Sodium: 143 mmol/L (ref 135–145)

## 2018-04-07 LAB — PROCALCITONIN
Procalcitonin: 0.45 ng/mL
Procalcitonin: 0.33 ng/mL

## 2018-04-07 LAB — PHOSPHORUS: Phosphorus: 2 mg/dL — ABNORMAL LOW (ref 2.5–4.6)

## 2018-04-07 MED ORDER — INSULIN DETEMIR 100 UNIT/ML ~~LOC~~ SOLN
10.0000 [IU] | Freq: Two times a day (BID) | SUBCUTANEOUS | Status: DC
  Administered 2018-04-07 (×2): 10 [IU] via SUBCUTANEOUS
  Filled 2018-04-07 (×5): qty 0.1

## 2018-04-07 MED ORDER — COLLAGENASE 250 UNIT/GM EX OINT
TOPICAL_OINTMENT | Freq: Every day | CUTANEOUS | Status: DC
  Administered 2018-04-08: 03:00:00 via TOPICAL
  Filled 2018-04-07: qty 30

## 2018-04-07 MED ORDER — INSULIN ASPART 100 UNIT/ML ~~LOC~~ SOLN
0.0000 [IU] | SUBCUTANEOUS | Status: DC
  Administered 2018-04-07: 2 [IU] via SUBCUTANEOUS
  Administered 2018-04-07 (×2): 1 [IU] via SUBCUTANEOUS
  Administered 2018-04-08: 5 [IU] via SUBCUTANEOUS
  Filled 2018-04-07 (×4): qty 1

## 2018-04-07 MED ORDER — POTASSIUM PHOSPHATE MONOBASIC 500 MG PO TABS
500.0000 mg | ORAL_TABLET | Freq: Three times a day (TID) | ORAL | Status: AC
  Administered 2018-04-07: 500 mg via ORAL
  Filled 2018-04-07: qty 1

## 2018-04-07 MED ORDER — ORAL CARE MOUTH RINSE
15.0000 mL | Freq: Two times a day (BID) | OROMUCOSAL | Status: DC
  Administered 2018-04-07 – 2018-04-08 (×3): 15 mL via OROMUCOSAL

## 2018-04-07 MED ORDER — DEXTROSE-NACL 5-0.45 % IV SOLN
INTRAVENOUS | Status: DC
  Administered 2018-04-07: 02:00:00 via INTRAVENOUS

## 2018-04-07 MED ORDER — POTASSIUM PHOSPHATE MONOBASIC 500 MG PO TABS
500.0000 mg | ORAL_TABLET | Freq: Three times a day (TID) | ORAL | Status: DC
  Filled 2018-04-07 (×2): qty 1

## 2018-04-07 NOTE — Evaluation (Signed)
Clinical/Bedside Swallow Evaluation Patient Details  Name: Duane Turner MRN: [id number] Date of Birth: 1933/04/21  Today's Date: 04/07/2018 Time: SLP Start Time (ACUTE ONLY): 1500 SLP Stop Time (ACUTE ONLY): 1555 SLP Time Calculation (min) (ACUTE ONLY): 55 min  Past Medical History:  Past Medical History:  Diagnosis Date  . Anemia   . Arthritis   . BPH (benign prostatic hyperplasia)   . Chronic systolic heart failure (HCC)   . Diabetes mellitus without complication (HCC)   . Dysphagia   . Hyperaldosteronism (HCC)   . Hypertension   . Renal disorder    Past Surgical History: History reviewed. No pertinent surgical history. HPI:  Pt is a 84 y.o. male with a known history of Dementia, Dysphagia on a thicken liquids diet at NH, hypertension, diabetes mellitus, hyperaldosteronism, arthritis, benign prostate hypertrophy was referred from facility for elevated blood sugar and confusion.  Patient blood sugar was more than 1400.  Anion gap was around 15.  Patient is dry and dehydrated.  Patient was worked up with CT head which showed no acute abnormality.  Patient unable to give much history has underlying dementia. Pt is currently followed by [facility].    Assessment / Plan / Recommendation Clinical Impression  Pt appears to present w/ oropharyngeal phase dysphagia which may be baseline for pt. Per report, pt is on thickened liquids at the NH; he is followed by Hospice/Palliative services there. Pt does have a baseline of Dementia, Dysphagia per chart notes. He required full assistance w/ feeding but was able to help hold cup when drinking - this can aid in swallowing safety. However, d/t his impulsivity and poor bolus volume awareness, he required pinching of the straw to limit volume at times. W/ the trials of Nectar consistency liquids and Purees given, pt exhibited no overt s/s of aspiration w/ no decline in vocal quality or O2 sats during/post trials(O2  98%). However, during the oral phase, min increased time was noted w/ bolus management as pt exhibited min prolonged A-P transfer time w/ min disorganized attention to bolus. W/ time, pt swallowed and cleared appropriately. Pt was fed to help control intake, oral clearing. No unilateral OM weakness noted. Recommend a Dysphagia level 1 w/ Nectar liquids diet w/ aspiration precautions; feeding support at meals; Pills given in Puree - Crushed as able. NSG updated.  SLP Visit Diagnosis: Dysphagia, oropharyngeal phase (R13.12)    Aspiration Risk  Mild aspiration risk;Moderate aspiration risk;Risk for inadequate nutrition/hydration    Diet Recommendation  Dysphagia level 1 (PUREE) w/ NECTAR consistency liquids; aspiration precautions; feeding support and monitoring w/ all oral intake d/t Cognitive decline/status  Medication Administration: Crushed with puree(as able for safer swallowing)    Other  Recommendations Recommended Consults: (Dietician f/u; Hospice services baseline) Oral Care Recommendations: Oral care BID;Staff/trained caregiver to provide oral care Other Recommendations: Order thickener from pharmacy;Prohibited food (jello, ice cream, thin soups);Remove water pitcher;Have oral suction available   Follow up Recommendations None      Frequency and Duration min 2x/week  1 week       Prognosis Prognosis for Safe Diet Advancement: Fair Barriers to Reach Goals: Cognitive deficits;Time post onset;Severity of deficits      Swallow Study   General Date of Onset: 04/06/18 HPI: Pt is a 84 y.o. male with a known history of Dementia, Dysphagia on a thicken liquids diet at NH, hypertension, diabetes mellitus, hyperaldosteronism, arthritis, benign prostate hypertrophy was referred from facility for elevated blood sugar and  confusion.  Patient blood sugar was more than 1400.  Anion gap was around 15.  Patient is dry and dehydrated.  Patient was worked up with CT head which showed no acute  abnormality.  Patient unable to give much history has underlying dementia. Pt is [facility]  Type of Study: Bedside Swallow Evaluation Previous Swallow Assessment: unknown [facility] Prior to this Study: Nectar-thick liquids(unsure of food consistency) Temperature Spikes Noted: No(wbc 10) Respiratory Status: Nasal cannula(2 liters) History of Recent Intubation: No Behavior/Cognition: Alert;Cooperative;Pleasant mood;Confused;Distractible;Requires cueing(baseline Dementia) Oral Cavity Assessment: Within Functional Limits Oral Care Completed by SLP: Recent completion by staff Oral Cavity - Dentition: (dentition) Vision: Functional for self-feeding(but difficulty w/ using his UEs) Self-Feeding Abilities: Needs assist;Needs set up;Total assist Patient Positioning: Upright in bed(needed positioning) Baseline Vocal Quality: Low vocal intensity(muttered/mumbled speech (baseline d/t Dementia?)) Volitional Cough: Cognitively unable to elicit Volitional Swallow: Unable to elicit    Oral/Motor/Sensory Function Overall Oral Motor/Sensory Function: Within functional limits(appeared adequate for bolus management/clearing)   Ice Chips Ice chips: Not tested   Thin Liquid Thin Liquid: Not tested    Nectar Thick Nectar Thick Liquid: Impaired(min) Presentation: Cup;Self Fed;Straw(fully assisted to monitor and control bolus volume) Oral Phase Impairments: Poor awareness of bolus(min) Oral phase functional implications: Prolonged oral transit(min disorganized) Pharyngeal Phase Impairments: (none) Other Comments: O2 sats remained 98%   Honey Thick Honey Thick Liquid: Not tested   Puree Puree: Impaired Presentation: Spoon(fed; ~7 trials ) Oral Phase Impairments: Poor awareness of bolus Oral Phase Functional Implications: Prolonged oral transit(min disorganized) Pharyngeal Phase Impairments: (none) Other Comments: O2 sats  remained 98%   Solid     Solid: Not tested      Jerilynn SomKatherine Sava Proby, MS, CCC-SLP Malyah Ohlrich 04/07/2018,4:00 PM

## 2018-04-07 NOTE — Progress Notes (Signed)
[facility] [place] at [facility]   PATIENT NAME: Duane Turner    MR#:  [id number]  DATE OF BIRTH:  Oct 23, 1932  SUBJECTIVE:  CHIEF COMPLAINT: Patient is pleasantly confused, has underlying dementia as per admitting physician's note.  No family was at bedside  REVIEW OF SYSTEMS:  Review of system unobtainable  DRUG ALLERGIES:  No Known Allergies  VITALS:  Blood pressure 93/65, pulse 87, temperature 98.1 F (36.7 C), temperature source Axillary, resp. rate 20, height 5\' 5"  (1.651 m), weight 58.4 kg, SpO2 (!) 68 %.  PHYSICAL EXAMINATION:  GENERAL:  84 y.o.-year-old patient lying in the bed with no acute distress.  EYES: Pupils equal, round, reactive to light and accommodation. No scleral icterus. Extraocular muscles intact.  HEENT: Head atraumatic, normocephalic. Oropharynx and nasopharynx clear.  NECK:  Supple, no jugular venous distention. No thyroid enlargement, no tenderness.  LUNGS: Normal breath sounds bilaterally, no wheezing, rales,rhonchi or crepitation. No use of accessory muscles of respiration.  CARDIOVASCULAR: S1, S2 normal. No murmurs, rubs, or gallops.  ABDOMEN: Soft, nontender, nondistended. Bowel sounds present. EXTREMITIES: No pedal edema, cyanosis, or clubbing.  NEUROLOGIC: Awake and alert and disoriented sensation intact. Gait not checked.  PSYCHIATRIC: The patient is alert and disoriented SKIN: No obvious rash, lesion, or ulcer.    LABORATORY PANEL:   CBC Recent Labs  Lab 04/07/18 0727  WBC 10.0  HGB 9.9*  HCT 30.5*  PLT 218   ------------------------------------------------------------------------------------------------------------------  Chemistries  Recent Labs  Lab 04/06/18 1503  04/07/18 0727  NA 132*   < > 143  K 3.8   < > 3.6  CL 87*   < > 108  CO2 28   < > 26  GLUCOSE 1,360*   < > 90  BUN 72*   < > 60*  CREATININE 2.43*   < > 1.98*  CALCIUM 8.6*   < > 8.1*  MG  --    < > 2.0  AST 29  --   --   ALT 25   --   --   ALKPHOS 173*  --   --   BILITOT 0.7  --   --    < > = values in this interval not displayed.   ------------------------------------------------------------------------------------------------------------------  Cardiac Enzymes No results for input(s): TROPONINI in the last 168 hours. ------------------------------------------------------------------------------------------------------------------  RADIOLOGY:  Dg Chest 2 View  Result Date: 04/06/2018 CLINICAL DATA:  Hypoglycemia. History of diabetes, hypertension and congestive heart failure. EXAM: CHEST - 2 VIEW COMPARISON:  02/08/2018 and 02/07/2018 radiographs. FINDINGS: 1436 hours. The heart size and mediastinal contours are stable. There is mild aortic atherosclerosis. Right upper lobe and bibasilar pulmonary opacities present on the prior study have resolved. There is minimal bibasilar atelectasis. No edema, confluent airspace opacity, pleural effusion or pneumothorax. No acute osseous findings are evident. Several buttons overlie the upper chest. IMPRESSION: No active cardiopulmonary process. Electronically Signed   By: Carey Bullocks M.D.   On: 04/06/2018 15:13   Ct Head Wo Contrast  Result Date: 04/06/2018 CLINICAL DATA:  Altered mental status with decreased orientation. Hyperglycemia. Diabetes. EXAM: CT HEAD WITHOUT CONTRAST TECHNIQUE: Contiguous axial images were obtained from the base of the skull through the vertex without intravenous contrast. COMPARISON:  CT head 02/06/2018. FINDINGS: Images were repeated due to motion. Brain: There is no evidence of acute intracranial hemorrhage, mass lesion, brain edema or extra-axial fluid collection. There is stable atrophy with prominence of the ventricles and subarachnoid spaces. There is stable mild  chronic small vessel ischemic change in the periventricular white matter bilaterally. There is no CT evidence of acute cortical infarction. Vascular: Intracranial vascular  calcifications. No hyperdense vessel identified. Skull: Unremarkable. Sinuses/Orbits: Partial opacification of the posterior ethmoid air cells on the left is similar to the previous study. The visualized paranasal sinuses, mastoid air cells and middle ears are otherwise clear. No orbital abnormalities are seen. Other: None. IMPRESSION: Stable head CT without acute intracranial findings. Stable atrophy and chronic small vessel ischemic changes. Electronically Signed   By: Carey BullocksWilliam  Veazey M.D.   On: 04/06/2018 15:29    EKG:   Orders placed or performed during the hospital encounter of 04/06/18  . ED EKG  . ED EKG    ASSESSMENT AND PLAN:    84 year old elderly male patient with history of chronic systolic heart failure, diabetes mellitus, hypertension, arthritis, hyperaldosteronism presented to the emergency room after being referred from facility for elevated blood sugar and confusion  -Nonketotic hyperglycemic hyperosmolar state Off insulin drip IV fluid hydration with D5 half-normal saline Monitor blood sugars closely Clear liquid diet Patient is on Levemir 10 units twice a day  -Chronic systolic heart failure Stable, not fluid overloaded.  Continue home medications  -Hyponatremia probably secondary to dehydration from nonketotic hyperglycemic hyperosmolar state Resolved sodium at 143 today IV fluids  Acute kidney injury from dehydration Clinically improving Creatinine at the time of admission 2.5-1.98  Underlying dementia Continue close monitoring  -DVT prophylaxis With subcu heparin    All the records are reviewed and case discussed with Care Management/Social Workerr. Management plans discussed with the patient, family and they are in agreement.  CODE STATUS: fc  TOTAL TIME TAKING CARE OF THIS PATIENT: 35 minutes.   POSSIBLE D/C IN 2-3  DAYS, DEPENDING ON CLINICAL CONDITION.  Note: This dictation was prepared with Dragon dictation along with smaller phrase  technology. Any transcriptional errors that result from this process are unintentional.   Ramonita LabAruna Pailynn Vahey M.D on 04/07/2018 at 1:19 PM  Between 7am to 6pm - Pager - [phone] After 6pm go to [email or web address] - password [email or web address] [facility]  [facility]  Office  [phone]  CC: Primary care physician; Lauro RegulusAnderson, Marshall W, MD

## 2018-04-07 NOTE — Progress Notes (Signed)
Inpatient Diabetes Program Recommendations  AACE/ADA: New Consensus Statement on Inpatient Glycemic Control (2015)  Target Ranges:  Prepandial:   less than 140 mg/dL      Peak postprandial:   less than 180 mg/dL (1-2 hours)      Critically ill patients:  140 - 180 mg/dL   Lab Results  Component Value Date   GLUCAP 79 04/07/2018   HGBA1C 6.6 (H) 01/31/2018    Review of Glycemic Control Results for Duane Turner, Duane Turner (MRN [id number]) as of 04/07/2018 09:11  Ref. Range 04/06/2018 11:15 04/06/2018 15:03 04/06/2018 18:08 04/06/2018 22:20 04/07/2018 07:27  Glucose Latest Ref Range: 70 - 99 mg/dL 7,8291,497 (HH) 5,6211,360 (HH) 3,0861,123 (HH) 656 (HH) 90    Diabetes history: Type 2 DM  Outpatient Diabetes medications:  None noted However it appears that patient has been on insulin in the past.  Current orders for Inpatient glycemic control:  IV insulin   Inpatient Diabetes Program Recommendations:    May consider adding Levemir 10 units bid upon transition off insulin drip.  May want to continue insulin in the NH for comfort to prevent extreme hyperglycemia.  If so, then blood sugars will need to be checked at least bid.   Thanks,  Beryl MeagerJenny Arcola Freshour, RN, BC-ADM Inpatient Diabetes Coordinator Pager [phone] (8a-5p)

## 2018-04-07 NOTE — Consult Note (Addendum)
WOC Nurse wound consult note Reason for Consult: Consult requested for sacrum Wound type: Chronic unstageable pressure injury Pressure Injury POA: Yes Measurement: 3.5X2.5X.2cm Wound bed: 50% red, 50% yellow slough Drainage (amount, consistency, odor) small amt tan drainage, no odor  Periwound: intact skin surrounding Dressing procedure/placement/frequency: Pt is frequently incontinent of large amt loose stool and it is difficult to keep the location from becoming soiled. Santyl to provide enzymatic debridement of nonviable tissue.  Pt is on a low airloss bed to reduce pressure.  No family present to discuss plan of care. Please re-consult if further assistance is needed.  Thank-you,  Cammie Mcgeeawn Lailie Smead MSN, RN, CWOCN, [place]AP, CNS [phone]

## 2018-04-07 NOTE — Plan of Care (Signed)
Patient transfer from ICU, CBG 126. Patients ate 75% of dinner, patient requires feeding.

## 2018-04-07 NOTE — Progress Notes (Signed)
Blood Glucose levels checked overnight with [employer] Glucometer did not carry over by end of 0700 shift.  04/06/18 2317- CBG 468 04/07/18 0015 - CBG 348 04/07/18 0127 - CBG 242 04/07/18 0230 - CBG 218 04/07/18 0325 - CBG 195 04/07/18 0430 - CBG 147 04/07/18 0530 - CBG 113 04/07/18 0627 - CBG 75 04/07/18 0740 - CBG 79

## 2018-04-07 NOTE — Progress Notes (Addendum)
Pharmacy Electrolyte Monitoring Consult:  Pharmacy consulted to assist in monitoring and replacing electrolytes in this 84 y.o. male admitted on 04/06/2018 with Hyperglycemia  Labs:  Sodium (mmol/L)  Date Value  04/07/2018 143   Potassium (mmol/L)  Date Value  04/07/2018 3.6   Magnesium (mg/dL)  Date Value  16/10/960412/16/2019 2.0   Phosphorus (mg/dL)  Date Value  54/09/811912/16/2019 2.0 (L)   Calcium (mg/dL)  Date Value  14/78/295612/16/2019 8.1 (L)   Albumin (g/dL)  Date Value  21/30/865712/15/2019 3.1 (L)    Assessment/Plan:  Patient is being converted off of insulin drip and will be transferred to floor. Will order potassium phosphate 500mg  PO x 1 dose.   Will check BMP and phosphorus with am labs. Will replete for goal potassium > 3.5, magnesium > 1.8, and phosphorus > 2.5.   Pharmacy will continue to monitor and adjust per consult.   Duane Turner L 04/07/2018 11:52 AM

## 2018-04-07 NOTE — Progress Notes (Signed)
Visit made. Patient is currently followed by [facility] with a diagnosis of heart disease. He is a DNR code with out of facility DNR in place at the facility. Family chose to have patient evaluated in the [facility] ED on 12/15 for evaluation of elevated blood sugar. Patient required an insulin drip and IV fluids. Transfer summary in place in hospital chart. Patient seen lying ion bed, appeared to be sleeping but easily awakened to voice. He was not oriented to time or place, requested water or coffee. Thickened water given, patient not able to hold cup, but did drink the whole cup w/o difficultly. Chart notes reviewed, no family at bedside. Per discussion with staff RN Trula Orehristina plan is for patient to transfer out of the ICU today. Will continue to follow and update hospice team. Dayna BarkerKaren Robertson RN, BSN, [facility], hospital Liaison [phone]

## 2018-04-08 LAB — GLUCOSE, CAPILLARY
Glucose-Capillary: 101 mg/dL — ABNORMAL HIGH (ref 70–99)
Glucose-Capillary: 64 mg/dL — ABNORMAL LOW (ref 70–99)
Glucose-Capillary: 90 mg/dL (ref 70–99)
Glucose-Capillary: 61 mg/dL — ABNORMAL LOW (ref 70–99)
Glucose-Capillary: 61 mg/dL — ABNORMAL LOW (ref 70–99)
Glucose-Capillary: 90 mg/dL (ref 70–99)
Glucose-Capillary: 258 mg/dL — ABNORMAL HIGH (ref 70–99)

## 2018-04-08 LAB — PROCALCITONIN: Procalcitonin: 0.18 ng/mL

## 2018-04-08 LAB — BASIC METABOLIC PANEL
Anion gap: 6 (ref 5–15)
BUN: 46 mg/dL — AB (ref 8–23)
CO2: 28 mmol/L (ref 22–32)
CREATININE: 1.32 mg/dL — AB (ref 0.61–1.24)
Calcium: 8.4 mg/dL — ABNORMAL LOW (ref 8.9–10.3)
Chloride: 111 mmol/L (ref 98–111)
GFR calc Af Amer: 57 mL/min — ABNORMAL LOW (ref >60)
GFR calc non Af Amer: 50 mL/min — ABNORMAL LOW (ref >60)
Glucose, Bld: 74 mg/dL (ref 70–99)
Potassium: 4 mmol/L (ref 3.5–5.1)
Sodium: 145 mmol/L (ref 135–145)

## 2018-04-08 LAB — PHOSPHORUS: Phosphorus: 2.8 mg/dL (ref 2.5–4.6)

## 2018-04-08 MED ORDER — INSULIN ASPART 100 UNIT/ML ~~LOC~~ SOLN: 0.0000 [IU] | SUBCUTANEOUS | 1 refills | Status: AC

## 2018-04-08 MED ORDER — INSULIN DETEMIR 100 UNIT/ML ~~LOC~~ SOLN
7.0000 [IU] | Freq: Two times a day (BID) | SUBCUTANEOUS | Status: DC
  Filled 2018-04-08 (×2): qty 0.07

## 2018-04-08 MED ORDER — INSULIN DETEMIR 100 UNIT/ML ~~LOC~~ SOLN: 7.0000 [IU] | Freq: Two times a day (BID) | SUBCUTANEOUS | 2 refills | Status: AC

## 2018-04-08 MED ORDER — MORPHINE SULFATE (CONCENTRATE) 20 MG/ML PO SOLN: 10.0000 mg | ORAL | 0 refills | Status: AC | PRN

## 2018-04-08 NOTE — Clinical Social Work Note (Signed)
Clinical Social Work Assessment  Patient Details  Name: Duane Turner MRN: [id number] Date of Birth: [birth year]  Date of referral:  04/08/18               Reason for consult:  Facility Placement                Permission sought to share information with:  Case Manager, Customer service manager, Family Supports Permission granted to share information::  Yes, Verbal Permission Granted  Name::        Agency::     Relationship::     Contact Information:     Housing/Transportation Living arrangements for the past 2 months:  Cash of Information:  Adult Children Patient Interpreter Needed:  None Criminal Activity/Legal Involvement Pertinent to Current Situation/Hospitalization:  No - Comment as needed Significant Relationships:  Adult Children Lives with:  Facility Resident Do you feel safe going back to the place where you live?  Yes Need for family participation in patient care:  Yes (Comment)  Care giving concerns:  Patient is a long term resident at [facility] Worker assessment / plan:  CSW consulted for facility placement. CSW met with patient and son Duane Turner at bedside. Son states that patient is from [facility] for long term care. Son would like patient to return to [facility] and continue with hospice services through [facility]. CSW notified Duane Turner, hospice liaison of plan. Patient will discharge back to [facility] today. CSW notified son and Duane Turner at [facility] of discharge today. Patient will transport by EMS. RN to call report and call for transport.   Employment status:  Retired Forensic scientist:  Other (Comment Required)(Hospice ) PT Recommendations:  Not assessed at this time Information / Referral to community resources:     Patient/Family's Response to care:  Family thanked CSW for assistance   Patient/Family's Understanding of and Emotional Response to Diagnosis, Current Treatment,  and Prognosis:  Family in agreement with discharge to [facility] today   Emotional Assessment Appearance:  Appears stated age Attitude/Demeanor/Rapport:    Affect (typically observed):  Accepting, Quiet Orientation:  Oriented to Self Alcohol / Substance use:  Not Applicable Psych involvement (Current and /or in the community):  No (Comment)  Discharge Needs  Concerns to be addressed:  Discharge Planning Concerns Readmission within the last 30 days:  No Current discharge risk:  None Barriers to Discharge:  Continued Medical Work up   [employer], [place] 04/08/2018, 9:54 AM

## 2018-04-08 NOTE — Progress Notes (Signed)
Inpatient Diabetes Program Recommendations  AACE/ADA: New Consensus Statement on Inpatient Glycemic Control (2015)  Target Ranges:  Prepandial:   less than 140 mg/dL      Peak postprandial:   less than 180 mg/dL (1-2 hours)      Critically ill patients:  140 - 180 mg/dL   Lab Results  Component Value Date   GLUCAP 90 04/08/2018   HGBA1C 6.6 (H) 01/31/2018    Review of Glycemic Control Results for Duane Turner, Duane Turner (MRN [id number]) as of 04/08/2018 10:20  Ref. Range 04/07/2018 20:24 04/08/2018 00:03 04/08/2018 04:05 04/08/2018 04:37 04/08/2018 07:47 04/08/2018 07:49 04/08/2018 08:19  Glucose-Capillary Latest Ref Range: 70 - 99 mg/dL 782162 (H) 956101 (H) 64 (L) 90 61 (L) 61 (L) 90   Diabetes history: Type 2 DM Outpatient Diabetes medications:  None noted However it appears that patient has been on insulin in the past.  Current orders for Inpatient glycemic control:  Levemir 10 units bid, Novolog sensitive q 4 hours Inpatient Diabetes Program Recommendations:    Note that blood sugars reduced this AM.  Agree with reduction of Levemir.  May consider holding Levemir this AM and potentially change to once a day?  Discussed with RN.  Sent secure chat to MD.   Thanks  Beryl MeagerJenny Haani Bakula, RN, BC-ADM Inpatient Diabetes Coordinator Pager [phone] (8a-5p)

## 2018-04-08 NOTE — Discharge Instructions (Signed)
Follow-up with hospice care as needed Follow-up with primary care physician at the facility as needed

## 2018-04-08 NOTE — Discharge Summary (Addendum)
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 Anemia   . Arthritis   . BPH (benign prostatic hyperplasia)   . Chronic systolic heart failure (HCC)   . Diabetes mellitus without complication (HCC)   . Dysphagia   . Hyperaldosteronism (HCC)   . Hypertension   . Renal disorder     HOSPITAL COURSE:   HISTORY OF PRESENT ILLNESS: Duane Turner  is a 84 y.o. male with a known history of hypertension, diabetes mellitus, hyperaldosteronism, arthritis, benign prostate hypertrophy was referred from facility for elevated blood sugar and confusion.  Patient blood sugar was more than 1400.  Anion gap was around 15.  Patient is dry and dehydrated.  Patient was worked up with CT head which showed no acute abnormality.  Patient unable to give much history has underlying dementia.  Nonketotic hyperglycemic hyperosmolar state Off insulin drip IV fluid hydration provided and patient clinically improved Monitor blood sugars closely Tolerating diet Patient is on Levemir 7 units twice a day, low-dose insulin sliding scale and Accu-Cheks twice a day at [facility]  -Chronic systolic heart failure Stable, not fluid overloaded.  Continue home medications  -Hyponatremia probably secondary to dehydration from nonketotic hyperglycemic hyperosmolar state Resolved sodium at 143 today IV fluids  Acute kidney injury from  dehydration Clinically improving Creatinine at the time of admission 2.5-1.98-1.32  Underlying dementia Continue close monitoring  -DVT prophylaxis With subcu heparin  Discussed with patient's son Duane Turner, patient does not meet criteria for hospice home so will discharge patient to [facility] to continue hospice care DISCHARGE CONDITIONS:   fair  CONSULTS OBTAINED:     PROCEDURES  None   DRUG ALLERGIES:  No Known Allergies  DISCHARGE MEDICATIONS:   Allergies as of 04/08/2018   No Known Allergies     Medication List    TAKE these medications   doxycycline 100 MG capsule Commonly known as:  MONODOX Take 100 mg by mouth 2 (two) times daily. For 7 days   insulin aspart 100 UNIT/ML injection Commonly known as:  novoLOG Inject 0-9 Units into the skin every 4 (four) hours. CBG < 70: implement hypoglycemia protocol CBG 70 - 120: 0 units CBG 121 - 150: 1 unit CBG 151 - 200: 2 units CBG 201 - 250: 3 units CBG 251 - 300: 5 units CBG 301 - 350: 7 units CBG 351 - 400: 9 units CBG > 400: call MD and obtain STAT lab verification   insulin detemir 100 UNIT/ML injection Commonly known as:  LEVEMIR Inject 0.07 mLs (7 Units total) into the skin 2 (two) times daily.   ipratropium-albuterol 0.5-2.5 (3) MG/3ML Soln Commonly known as:  DUONEB Take 3 mLs by nebulization every 4 (four) hours as needed.   midodrine 10 MG tablet Commonly known as:  PROAMATINE Take 10 mg by mouth every 8 (eight) hours. For systolic blood pressure of less than 90   morphine 20 MG/ML concentrated solution Commonly  known as:  ROXANOL Take 0.5 mLs (10 mg total) by mouth every hour as needed for severe pain.   potassium chloride SA 20 MEQ tablet Commonly known as:  K-DUR,KLOR-CON Take 20 mEq by mouth daily. With or after a meal and 4 oz liquid   pravastatin 40 MG tablet Commonly known as:  PRAVACHOL Take 40 mg by mouth at bedtime.   senna 8.6 MG tablet Commonly known as:  SENOKOT Take  1 tablet by mouth 2 (two) times daily as needed for constipation.   torsemide 20 MG tablet Commonly known as:  DEMADEX Take 20 mg by mouth 2 (two) times daily.        DISCHARGE INSTRUCTIONS:   Follow-up with hospice care as needed  DIET:  Cardiac diet-dysphagia 1 diet with nectar thick liquids  DISCHARGE CONDITION:  Fair  ACTIVITY:  Activity as tolerated  OXYGEN:  Home Oxygen: No.   Oxygen Delivery: room air  DISCHARGE LOCATION:  nursing home   If you experience worsening of your admission symptoms, develop shortness of breath, life threatening emergency, suicidal or homicidal thoughts you must seek medical attention immediately by calling 911 or calling your MD immediately  if symptoms less severe.  You Must read complete instructions/literature along with all the possible adverse reactions/side effects for all the Medicines you take and that have been prescribed to you. Take any new Medicines after you have completely understood and accpet all the possible adverse reactions/side effects.   Please note  You were cared for by a hospitalist during your hospital stay. If you have any questions about your discharge medications or the care you received while you were in the hospital after you are discharged, you can call the unit and asked to speak with the hospitalist on call if the hospitalist that took care of you is not available. Once you are discharged, your primary care physician will handle any further medical issues. Please note that NO REFILLS for any discharge medications will be authorized once you are discharged, as it is imperative that you return to your primary care physician (or establish a relationship with a primary care physician if you do not have one) for your aftercare needs so that they can reassess your need for medications and monitor your lab values.     Today  Chief Complaint  Patient presents with  . Hyperglycemia   Patient is wide awake and  alert and tolerating diet and answering  questions Plan of care discussed with the patient and son ROS:  CONSTITUTIONAL: Denies fevers, chills. Denies any fatigue, weakness.  EYES: Denies blurry vision, double vision, eye pain. EARS, NOSE, THROAT: Denies tinnitus, ear pain, hearing loss. RESPIRATORY: Denies cough, wheeze, shortness of breath.  CARDIOVASCULAR: Denies chest pain, palpitations, edema.  GASTROINTESTINAL: Denies nausea, vomiting, diarrhea, abdominal pain. Denies bright red blood per rectum. MUSCULOSKELETAL: Denies pain in neck, back, shoulder, knees, hips or arthritic symptoms.  NEUROLOGIC: Denies paralysis, paresthesias.  PSYCHIATRIC: Denies anxiety or depressive symptoms.   VITAL SIGNS:  Blood pressure 101/69, pulse 72, temperature 98 F (36.7 C), temperature source Oral, resp. rate 17, height 5\' 5"  (1.651 m), weight 58.4 kg, SpO2 100 %.  I/O:    Intake/Output Summary (Last 24 hours) at 04/08/2018 1004 Last data filed at 04/08/2018 0439 Gross per 24 hour  Intake 715.29 ml  Output 1751 ml  Net -1035.71 ml    PHYSICAL EXAMINATION:  GENERAL:  84 y.o.-year-old patient lying in the bed with no acute distress.  EYES: Pupils  equal, round, reactive to light and accommodation. No scleral icterus. Extraocular muscles intact.  HEENT: Head atraumatic, normocephalic. Oropharynx and nasopharynx clear.  NECK:  Supple, no jugular venous distention. No thyroid enlargement, no tenderness.  LUNGS: Normal breath sounds bilaterally, no wheezing, rales,rhonchi or crepitation. No use of accessory muscles of respiration.  CARDIOVASCULAR: S1, S2 normal. No murmurs, rubs, or gallops.  ABDOMEN: Soft, non-tender, non-distended. Bowel sounds present. No organomegaly or mass.  EXTREMITIES: No pedal edema, cyanosis, or clubbing.  NEUROLOGIC: Awake, alert and oriented x2-3 sensation intact. Gait not checked.  PSYCHIATRIC: The patient is alert and oriented x2- 3.  SKIN: No obvious rash, lesion,  or ulcer.   DATA REVIEW:   CBC Recent Labs  Lab 04/07/18 0727  WBC 10.0  HGB 9.9*  HCT 30.5*  PLT 218    Chemistries  Recent Labs  Lab 04/06/18 1503  04/07/18 0727 04/08/18 0626  NA 132*   < > 143 145  K 3.8   < > 3.6 4.0  CL 87*   < > 108 111  CO2 28   < > 26 28  GLUCOSE 1,360*   < > 90 74  BUN 72*   < > 60* 46*  CREATININE 2.43*   < > 1.98* 1.32*  CALCIUM 8.6*   < > 8.1* 8.4*  MG  --    < > 2.0  --   AST 29  --   --   --   ALT 25  --   --   --   ALKPHOS 173*  --   --   --   BILITOT 0.7  --   --   --    < > = values in this interval not displayed.    Cardiac Enzymes No results for input(s): TROPONINI in the last 168 hours.  Microbiology Results  Results for orders placed or performed during the hospital encounter of 04/06/18  MRSA PCR Screening     Status: None   Collection Time: 04/06/18  5:42 PM  Result Value Ref Range Status   MRSA by PCR NEGATIVE NEGATIVE Final    Comment:        The GeneXpert MRSA Assay (FDA approved for NASAL specimens only), is one component of a comprehensive MRSA colonization surveillance program. It is not intended to diagnose MRSA infection nor to guide or monitor treatment for MRSA infections. Performed at [facility], [address], [place], [place] [address]   Urine Culture     Status: Abnormal (Preliminary result)   Collection Time: 04/06/18 10:02 PM  Result Value Ref Range Status   Specimen Description   Final    URINE, CATHETERIZED Performed at [facility], [address] [place], [place] [address]    Special Requests   Final    NONE Performed at [facility], [address] [place], [place] [address]    Culture (A)  Final    60,000 COLONIES/mL PROTEUS MIRABILIS SUSCEPTIBILITIES TO FOLLOW Performed at [facility], [address] [place], [place] [address]    Report Status PENDING  Incomplete    RADIOLOGY:  Dg Chest 2 View  Result Date: 04/06/2018 CLINICAL DATA:   Hypoglycemia. History of diabetes, hypertension and congestive heart failure. EXAM: CHEST - 2 VIEW COMPARISON:  02/08/2018 and 02/07/2018 radiographs. FINDINGS: 1436 hours. The heart size and mediastinal contours are stable. There is mild aortic atherosclerosis. Right upper lobe and bibasilar pulmonary opacities present on the prior study have resolved.  There is minimal bibasilar atelectasis. No edema, confluent airspace opacity, pleural effusion or pneumothorax. No acute osseous findings are evident. Several buttons overlie the upper chest. IMPRESSION: No active cardiopulmonary process. Electronically Signed   By: Carey Bullocks M.D.   On: 04/06/2018 15:13   Ct Head Wo Contrast  Result Date: 04/06/2018 CLINICAL DATA:  Altered mental status with decreased orientation. Hyperglycemia. Diabetes. EXAM: CT HEAD WITHOUT CONTRAST TECHNIQUE: Contiguous axial images were obtained from the base of the skull through the vertex without intravenous contrast. COMPARISON:  CT head 02/06/2018. FINDINGS: Images were repeated due to motion. Brain: There is no evidence of acute intracranial hemorrhage, mass lesion, brain edema or extra-axial fluid collection. There is stable atrophy with prominence of the ventricles and subarachnoid spaces. There is stable mild chronic small vessel ischemic change in the periventricular white matter bilaterally. There is no CT evidence of acute cortical infarction. Vascular: Intracranial vascular calcifications. No hyperdense vessel identified. Skull: Unremarkable. Sinuses/Orbits: Partial opacification of the posterior ethmoid air cells on the left is similar to the previous study. The visualized paranasal sinuses, mastoid air cells and middle ears are otherwise clear. No orbital abnormalities are seen. Other: None. IMPRESSION: Stable head CT without acute intracranial findings. Stable atrophy and chronic small vessel ischemic changes. Electronically Signed   By: Carey Bullocks M.D.   On:  04/06/2018 15:29    EKG:   Orders placed or performed during the hospital encounter of 04/06/18  . ED EKG  . ED EKG      Management plans discussed with the patient, family and they are in agreement.  CODE STATUS:     Code Status Orders  (From admission, onward)         Start     Ordered   04/06/18 1736  Do not attempt resuscitation (DNR)  Continuous    Question Answer Comment  In the event of cardiac or respiratory ARREST Do not call a "code blue"   In the event of cardiac or respiratory ARREST Do not perform Intubation, CPR, defibrillation or ACLS   In the event of cardiac or respiratory ARREST Use medication by any route, position, wound care, and other measures to relive pain and suffering. May use oxygen, suction and manual treatment of airway obstruction as needed for comfort.   Comments Pt. to transition to comfort care/hospice      04/06/18 1735        Code Status History    Date Active Date Inactive Code Status Order ID Comments User Context   02/09/2018 1132 02/10/2018 1806 DNR [id number]  Milagros Loll, MD Inpatient   02/08/2018 1700 02/09/2018 1132 DNR [id number]  Salena Saner, MD Inpatient   02/05/2018 1512 02/08/2018 1700 DNR [id number]  Eugenie Norrie, NP Inpatient   02/05/2018 1355 02/05/2018 1512 Partial Code [id number]  Duane Austin, MD ED   02/05/2018 1347 02/05/2018 1355 Partial Code [id number]  Duane Austin, MD ED   02/05/2018 1346 02/05/2018 1347 DNR [id number]  Duane Austin, MD ED   01/31/2018 2022 02/02/2018 1712 DNR [id number]  Shaune Pollack, MD Inpatient    Advance Directive Documentation     Most Recent Value  Type of Advance Directive  Out of facility DNR (pink MOST or yellow form)  Pre-existing out of facility DNR order (yellow form or pink MOST form)  -  "MOST" Form in Place?  -      TOTAL TIME TAKING CARE OF THIS PATIENT: 45  minutes.   Note: This dictation was prepared  with Dragon dictation along with smaller phrase  technology. Any transcriptional errors that result from this process are unintentional.   @M[facility] @  on 04/08/2018 at 10:04 AM  Between 7am to 6pm - Pager - [phone]  After 6pm go to [email or web address] - password [email or web address] [facility]  [place] [facility]  Office  [phone]  CC: Primary care physician; Duane Regulus, MD

## 2018-04-08 NOTE — Progress Notes (Signed)
Visit made. Patient seen sitting up in bed, aide fed him breakfast this morning. Patient alert and able to interact with Clinical research associatewriter. Blood sugars have been controlled with sliding scale and Levimir. Writer has confirmed with patient's hospice RN that [facility] can continue [facility]'s insulin and will monitor blood sugars. Discussed with attending physician Dr. Amado CoeGouru. Plan is for discharge today via EMS. DNR in place in patient's chart. Discharge summary faxed to triage, Hopsice team updated to discharge. Dayna BarkerKaren Robertson RN, BSN, [facility], [facility] liaison [phone]

## 2018-04-08 NOTE — Progress Notes (Signed)
Report called to Leo GrosserJennifer Clapp LPN at [facility]

## 2018-04-08 NOTE — Progress Notes (Addendum)
Pharmacy Electrolyte Monitoring Consult:  Pharmacy consulted to assist in monitoring and replacing electrolytes in this 84 y.o. male admitted on 04/06/2018 with Hyperglycemia  Labs:  Sodium (mmol/L)  Date Value  04/08/2018 145   Potassium (mmol/L)  Date Value  04/08/2018 4.0   Magnesium (mg/dL)  Date Value  16/10/960412/16/2019 2.0   Phosphorus (mg/dL)  Date Value  54/09/811912/17/2019 2.8   Calcium (mg/dL)  Date Value  14/78/295612/17/2019 8.4 (L)   Albumin (g/dL)  Date Value  21/30/865712/15/2019 3.1 (L)    Assessment/Plan:    Patient is at goal potassium > 3.5, magnesium > 1.8, and phosphorus > 2.5.   Glucose has been @ goal ~ 24 hours.   Will check potassium with am labs.  Albina Billetharles M Novah Goza, PharmD, BCPS Clinical Pharmacist 04/08/2018 7:18 AM

## 2018-04-08 NOTE — NC FL2 (Signed)
  [place] MEDICAID FL2 LEVEL OF CARE SCREENING TOOL     IDENTIFICATION  Patient Name: Duane RilesJohn Turner Birthdate: 21-Nov-1932 Sex: male Admission Date (Current Location): 04/06/2018  [facility] [place]   Facility and Address:  [facility], [address], [place], [place] [address]      Provider Number: [id number]  Attending Physician Name and Address:  Ramonita LabGouru, Aruna, MD  Relative Name and Phone Number:       Current Level of Care: Hospital Recommended Level of Care: Skilled Nursing Facility Prior Approval Number:    Date Approved/Denied:   PASRR Number:    Discharge Plan: SNF    Current Diagnoses: Patient Active Problem List   Diagnosis Date Noted  . Diabetic hyperosmolar non-ketotic state (HCC) 04/06/2018  . Pressure injury of skin 04/06/2018  . Sepsis (HCC) 02/05/2018  . Hyperkalemia 01/31/2018  . DIABETES MELLITUS, TYPE II, UNCONTROLLED 05/17/2009  . HYPERTENSION, BENIGN ESSENTIAL 05/17/2009  . TOBACCO USER 01/29/2009  . ERECTILE DYSFUNCTION 12/23/2007  . HYPERLIPIDEMIA 11/29/2006  . ANEMIA NOS 11/01/2006  . PROTEINURIA, MILD 11/01/2006    Orientation RESPIRATION BLADDER Height & Weight     Self  Normal Continent Weight: 128 lb 12 oz (58.4 kg) Height:  5\' 5"  (165.1 cm)  BEHAVIORAL SYMPTOMS/MOOD NEUROLOGICAL BOWEL NUTRITION STATUS  (none) (none) Incontinent Diet(Dysphagia 1 )  AMBULATORY STATUS COMMUNICATION OF NEEDS Skin   Extensive Assist Verbally Other (Comment)(Unstageable on Sacrum )                       Personal Care Assistance Level of Assistance  Bathing, Feeding, Dressing Bathing Assistance: Limited assistance Feeding assistance: Independent Dressing Assistance: Limited assistance     Functional Limitations Info  Sight, Hearing, Speech Sight Info: Adequate Hearing Info: Adequate Speech Info: Adequate    SPECIAL CARE FACTORS FREQUENCY                       Contractures  Contractures Info: Not present    Additional Factors Info  Code Status, Allergies, Insulin Sliding Scale Code Status Info: DNR Allergies Info: NKA           Current Medications (04/08/2018):  This is the current hospital active medication list Current Facility-Administered Medications  Medication Dose Route Frequency Provider Last Rate Last Dose  . collagenase (SANTYL) ointment   Topical Daily Gouru, Aruna, MD      . dextrose 50 % solution 25 mL  25 mL Intravenous PRN Arnaldo NatalMalinda, Paul F, MD      . heparin injection 5,000 Units  5,000 Units Subcutaneous Q8H Ihor AustinPyreddy, Pavan, MD   5,000 Units at 04/08/18 0559  . insulin aspart (novoLOG) injection 0-9 Units  0-9 Units Subcutaneous Q4H Erin FullingKasa, Kurian, MD   2 Units at 04/07/18 2040  . insulin detemir (LEVEMIR) injection 7 Units  7 Units Subcutaneous BID Gouru, Aruna, MD      . MEDLINE mouth rinse  15 mL Mouth Rinse BID Tukov-Yual, Magdalene S, NP   15 mL at 04/08/18 0251     Discharge Medications: Please see discharge summary for a list of discharge medications.  Relevant Imaging Results:  Relevant Lab Results:   Additional Information    Trayvion Embleton  Rinaldo RatelGarrison, [address] RdCSWA

## 2018-04-09 LAB — URINE CULTURE: Culture: 60000 — AB

## 2018-05-24 DEATH — deceased
